# Patient Record
Sex: Female | Born: 1940 | Race: Black or African American | Hispanic: No | Smoking: Never smoker
Health system: Southern US, Community
[De-identification: ages and names within clinical notes are randomized; demographics above are authoritative.]

## PROBLEM LIST (undated history)

## (undated) DIAGNOSIS — I10 Essential (primary) hypertension: Secondary | ICD-10-CM

---

## 1995-05-28 ENCOUNTER — Encounter (INDEPENDENT_AMBULATORY_CARE_PROVIDER_SITE_OTHER): Payer: Self-pay | Admitting: *Deleted

## 1996-11-08 ENCOUNTER — Encounter: Payer: Self-pay | Admitting: Internal Medicine

## 1996-11-12 LAB — TB SKIN TEST: TB Skin Test: NEGATIVE mm

## 1996-12-22 ENCOUNTER — Encounter: Payer: Self-pay | Admitting: Internal Medicine

## 1997-02-20 ENCOUNTER — Encounter: Payer: Self-pay | Admitting: Internal Medicine

## 1997-10-09 ENCOUNTER — Encounter: Payer: Self-pay | Admitting: Internal Medicine

## 1998-01-22 ENCOUNTER — Encounter: Payer: Self-pay | Admitting: Internal Medicine

## 1998-06-11 ENCOUNTER — Encounter: Admission: RE | Admit: 1998-06-11 | Discharge: 1998-06-11 | Payer: Self-pay | Admitting: Internal Medicine

## 1998-06-11 ENCOUNTER — Encounter: Payer: Self-pay | Admitting: Internal Medicine

## 1998-07-09 ENCOUNTER — Encounter: Admission: RE | Admit: 1998-07-09 | Discharge: 1998-07-09 | Payer: Self-pay | Admitting: Internal Medicine

## 1998-07-25 ENCOUNTER — Ambulatory Visit (HOSPITAL_COMMUNITY): Admission: RE | Admit: 1998-07-25 | Discharge: 1998-07-25 | Payer: Self-pay | Admitting: Gastroenterology

## 1998-09-11 ENCOUNTER — Encounter: Payer: Self-pay | Admitting: Internal Medicine

## 1998-09-11 ENCOUNTER — Encounter: Admission: RE | Admit: 1998-09-11 | Discharge: 1998-09-11 | Payer: Self-pay | Admitting: Internal Medicine

## 1998-10-09 ENCOUNTER — Encounter: Admission: RE | Admit: 1998-10-09 | Discharge: 1998-10-09 | Payer: Self-pay | Admitting: Internal Medicine

## 1998-11-06 ENCOUNTER — Encounter: Admission: RE | Admit: 1998-11-06 | Discharge: 1998-11-06 | Payer: Self-pay | Admitting: Infectious Diseases

## 1999-01-08 ENCOUNTER — Ambulatory Visit (HOSPITAL_COMMUNITY): Admission: RE | Admit: 1999-01-08 | Discharge: 1999-01-08 | Payer: Self-pay | Admitting: Infectious Diseases

## 1999-01-22 ENCOUNTER — Encounter: Admission: RE | Admit: 1999-01-22 | Discharge: 1999-01-22 | Payer: Self-pay | Admitting: Internal Medicine

## 1999-04-08 ENCOUNTER — Ambulatory Visit (HOSPITAL_COMMUNITY): Admission: RE | Admit: 1999-04-08 | Discharge: 1999-04-08 | Payer: Self-pay | Admitting: Internal Medicine

## 1999-04-23 ENCOUNTER — Encounter: Admission: RE | Admit: 1999-04-23 | Discharge: 1999-04-23 | Payer: Self-pay | Admitting: Internal Medicine

## 1999-07-05 ENCOUNTER — Ambulatory Visit (HOSPITAL_COMMUNITY): Admission: RE | Admit: 1999-07-05 | Discharge: 1999-07-05 | Payer: Self-pay | Admitting: Internal Medicine

## 1999-07-05 ENCOUNTER — Encounter: Admission: RE | Admit: 1999-07-05 | Discharge: 1999-07-05 | Payer: Self-pay | Admitting: Internal Medicine

## 1999-07-22 ENCOUNTER — Encounter: Admission: RE | Admit: 1999-07-22 | Discharge: 1999-07-22 | Payer: Self-pay | Admitting: Internal Medicine

## 1999-07-22 ENCOUNTER — Other Ambulatory Visit: Admission: RE | Admit: 1999-07-22 | Discharge: 1999-07-22 | Payer: Self-pay | Admitting: Radiology

## 1999-07-23 ENCOUNTER — Other Ambulatory Visit: Admission: RE | Admit: 1999-07-23 | Discharge: 1999-07-23 | Payer: Self-pay | Admitting: Radiology

## 1999-08-09 ENCOUNTER — Encounter (HOSPITAL_BASED_OUTPATIENT_CLINIC_OR_DEPARTMENT_OTHER): Payer: Self-pay | Admitting: General Surgery

## 1999-08-15 ENCOUNTER — Encounter (HOSPITAL_BASED_OUTPATIENT_CLINIC_OR_DEPARTMENT_OTHER): Payer: Self-pay | Admitting: General Surgery

## 1999-08-15 ENCOUNTER — Ambulatory Visit (HOSPITAL_COMMUNITY): Admission: RE | Admit: 1999-08-15 | Discharge: 1999-08-15 | Payer: Self-pay | Admitting: General Surgery

## 1999-08-22 ENCOUNTER — Encounter: Admission: RE | Admit: 1999-08-22 | Discharge: 1999-08-22 | Payer: Self-pay | Admitting: Internal Medicine

## 1999-09-12 ENCOUNTER — Encounter: Admission: RE | Admit: 1999-09-12 | Discharge: 1999-12-11 | Payer: Self-pay | Admitting: Radiation Oncology

## 1999-09-16 ENCOUNTER — Encounter: Admission: RE | Admit: 1999-09-16 | Discharge: 1999-09-16 | Payer: Self-pay | Admitting: Internal Medicine

## 1999-09-16 ENCOUNTER — Ambulatory Visit (HOSPITAL_COMMUNITY): Admission: RE | Admit: 1999-09-16 | Discharge: 1999-09-16 | Payer: Self-pay | Admitting: Internal Medicine

## 1999-09-30 ENCOUNTER — Encounter: Admission: RE | Admit: 1999-09-30 | Discharge: 1999-09-30 | Payer: Self-pay | Admitting: Internal Medicine

## 2000-05-25 ENCOUNTER — Ambulatory Visit (HOSPITAL_COMMUNITY): Admission: RE | Admit: 2000-05-25 | Discharge: 2000-05-25 | Payer: Self-pay | Admitting: Internal Medicine

## 2000-05-25 ENCOUNTER — Encounter: Admission: RE | Admit: 2000-05-25 | Discharge: 2000-05-25 | Payer: Self-pay | Admitting: Internal Medicine

## 2000-06-08 ENCOUNTER — Encounter: Admission: RE | Admit: 2000-06-08 | Discharge: 2000-06-08 | Payer: Self-pay | Admitting: Internal Medicine

## 2000-10-12 ENCOUNTER — Ambulatory Visit (HOSPITAL_COMMUNITY): Admission: RE | Admit: 2000-10-12 | Discharge: 2000-10-12 | Payer: Self-pay | Admitting: Internal Medicine

## 2000-10-12 ENCOUNTER — Encounter: Admission: RE | Admit: 2000-10-12 | Discharge: 2000-10-12 | Payer: Self-pay | Admitting: Internal Medicine

## 2000-10-15 ENCOUNTER — Encounter: Admission: RE | Admit: 2000-10-15 | Discharge: 2000-10-15 | Payer: Self-pay | Admitting: Family Medicine

## 2000-10-15 ENCOUNTER — Encounter: Payer: Self-pay | Admitting: Family Medicine

## 2001-05-19 ENCOUNTER — Ambulatory Visit (HOSPITAL_COMMUNITY): Admission: RE | Admit: 2001-05-19 | Discharge: 2001-05-19 | Payer: Self-pay | Admitting: Internal Medicine

## 2001-05-19 ENCOUNTER — Encounter: Admission: RE | Admit: 2001-05-19 | Discharge: 2001-05-19 | Payer: Self-pay | Admitting: Internal Medicine

## 2001-06-01 ENCOUNTER — Encounter: Admission: RE | Admit: 2001-06-01 | Discharge: 2001-06-01 | Payer: Self-pay | Admitting: Internal Medicine

## 2002-05-30 ENCOUNTER — Ambulatory Visit (HOSPITAL_COMMUNITY): Admission: RE | Admit: 2002-05-30 | Discharge: 2002-05-30 | Payer: Self-pay | Admitting: Internal Medicine

## 2002-05-30 ENCOUNTER — Encounter: Admission: RE | Admit: 2002-05-30 | Discharge: 2002-05-30 | Payer: Self-pay | Admitting: Internal Medicine

## 2003-05-31 ENCOUNTER — Encounter: Payer: Self-pay | Admitting: Internal Medicine

## 2003-05-31 ENCOUNTER — Encounter: Admission: RE | Admit: 2003-05-31 | Discharge: 2003-05-31 | Payer: Self-pay | Admitting: Internal Medicine

## 2003-05-31 ENCOUNTER — Ambulatory Visit (HOSPITAL_COMMUNITY): Admission: RE | Admit: 2003-05-31 | Discharge: 2003-05-31 | Payer: Self-pay | Admitting: Internal Medicine

## 2003-06-13 ENCOUNTER — Encounter: Admission: RE | Admit: 2003-06-13 | Discharge: 2003-06-13 | Payer: Self-pay | Admitting: Internal Medicine

## 2003-10-19 ENCOUNTER — Encounter: Payer: Self-pay | Admitting: Internal Medicine

## 2003-10-19 ENCOUNTER — Encounter: Admission: RE | Admit: 2003-10-19 | Discharge: 2003-10-19 | Payer: Self-pay | Admitting: Internal Medicine

## 2003-10-19 ENCOUNTER — Ambulatory Visit (HOSPITAL_COMMUNITY): Admission: RE | Admit: 2003-10-19 | Discharge: 2003-10-19 | Payer: Self-pay | Admitting: Internal Medicine

## 2004-05-27 ENCOUNTER — Encounter: Admission: RE | Admit: 2004-05-27 | Discharge: 2004-05-27 | Payer: Self-pay | Admitting: Internal Medicine

## 2004-05-27 ENCOUNTER — Ambulatory Visit (HOSPITAL_COMMUNITY): Admission: RE | Admit: 2004-05-27 | Discharge: 2004-05-27 | Payer: Self-pay | Admitting: Internal Medicine

## 2004-06-10 ENCOUNTER — Encounter: Admission: RE | Admit: 2004-06-10 | Discharge: 2004-06-10 | Payer: Self-pay | Admitting: Internal Medicine

## 2004-07-11 ENCOUNTER — Ambulatory Visit: Payer: Self-pay | Admitting: Internal Medicine

## 2004-10-15 ENCOUNTER — Ambulatory Visit (HOSPITAL_COMMUNITY): Admission: RE | Admit: 2004-10-15 | Discharge: 2004-10-15 | Payer: Self-pay | Admitting: Internal Medicine

## 2004-10-15 ENCOUNTER — Ambulatory Visit: Payer: Self-pay | Admitting: Internal Medicine

## 2004-12-03 ENCOUNTER — Ambulatory Visit: Payer: Self-pay | Admitting: Internal Medicine

## 2005-05-12 ENCOUNTER — Ambulatory Visit: Payer: Self-pay | Admitting: Internal Medicine

## 2005-05-12 ENCOUNTER — Ambulatory Visit (HOSPITAL_COMMUNITY): Admission: RE | Admit: 2005-05-12 | Discharge: 2005-05-12 | Payer: Self-pay | Admitting: Internal Medicine

## 2005-05-27 ENCOUNTER — Ambulatory Visit: Payer: Self-pay | Admitting: Internal Medicine

## 2005-11-11 ENCOUNTER — Ambulatory Visit (HOSPITAL_COMMUNITY): Admission: RE | Admit: 2005-11-11 | Discharge: 2005-11-11 | Payer: Self-pay | Admitting: Internal Medicine

## 2005-11-11 ENCOUNTER — Ambulatory Visit: Payer: Self-pay | Admitting: Internal Medicine

## 2005-11-11 ENCOUNTER — Encounter (INDEPENDENT_AMBULATORY_CARE_PROVIDER_SITE_OTHER): Payer: Self-pay | Admitting: *Deleted

## 2005-11-11 LAB — CONVERTED CEMR LAB: CD4 Count: 360 microliters

## 2005-11-25 ENCOUNTER — Ambulatory Visit: Payer: Self-pay | Admitting: Internal Medicine

## 2006-05-19 ENCOUNTER — Ambulatory Visit: Payer: Self-pay | Admitting: Internal Medicine

## 2006-05-19 ENCOUNTER — Encounter: Admission: RE | Admit: 2006-05-19 | Discharge: 2006-05-19 | Payer: Self-pay | Admitting: Internal Medicine

## 2006-05-19 ENCOUNTER — Encounter (INDEPENDENT_AMBULATORY_CARE_PROVIDER_SITE_OTHER): Payer: Self-pay | Admitting: *Deleted

## 2006-05-19 LAB — CONVERTED CEMR LAB
CD4 Count: 290 microliters
HIV 1 RNA Quant: 2170 copies/mL

## 2006-06-02 ENCOUNTER — Ambulatory Visit: Payer: Self-pay | Admitting: Internal Medicine

## 2006-06-02 ENCOUNTER — Encounter: Admission: RE | Admit: 2006-06-02 | Discharge: 2006-06-02 | Payer: Self-pay | Admitting: Internal Medicine

## 2006-06-09 ENCOUNTER — Ambulatory Visit: Payer: Self-pay | Admitting: Internal Medicine

## 2006-08-25 ENCOUNTER — Ambulatory Visit: Payer: Self-pay | Admitting: Internal Medicine

## 2006-08-25 ENCOUNTER — Encounter (INDEPENDENT_AMBULATORY_CARE_PROVIDER_SITE_OTHER): Payer: Self-pay | Admitting: *Deleted

## 2006-08-25 ENCOUNTER — Encounter: Admission: RE | Admit: 2006-08-25 | Discharge: 2006-08-25 | Payer: Self-pay | Admitting: Internal Medicine

## 2006-08-25 LAB — CONVERTED CEMR LAB
ALT: 23 units/L (ref 0–40)
AST: 27 units/L (ref 0–37)
Albumin: 3.6 g/dL (ref 3.5–5.2)
Alkaline Phosphatase: 82 units/L (ref 39–117)
BUN: 16 mg/dL (ref 6–23)
Basophils Absolute: 0 10*3/uL (ref 0.0–0.1)
Basophils percent auto: 0 % (ref 0–1)
CD4 Count: 310 uL
CO2: 27 meq/L (ref 19–32)
Calcium: 9.6 mg/dL (ref 8.4–10.5)
Chloride: 99 meq/L (ref 96–112)
Creatinine, Ser: 0.8 mg/dL (ref 0.40–1.20)
Eosinophils Absolute: 0 cells/mcL (ref 0.0–0.7)
Eosinophils Relative: 1 % (ref 0–5)
Glucose, Bld: 74 mg/dL (ref 70–99)
HCT: 31.7 % — ABNORMAL LOW (ref 36.0–46.0)
HIV 1 RNA Quant: 1900 copies/mL — ABNORMAL HIGH (ref <50)
HIV 1 RNA Quant: 1900 {copies}/mL
HIV-1 RNA Quant, Log: 3.28 — ABNORMAL HIGH (ref <1.70)
Hemoglobin: 10 g/dL — ABNORMAL LOW (ref 12.0–15.0)
Leukocyte count, blood: 4.7 10*9/L (ref 4.0–10.5)
Lymphocytes Relative: 43 % (ref 12–46)
Lymphs Abs: 2 10*3/uL (ref 0.7–3.3)
MCHC: 31.5 g/dL (ref 30.0–36.0)
MCV: 86.4 fL (ref 78.0–100.0)
Monocytes Absolute: 0.6 10*3/uL (ref 0.2–0.7)
Monocytes Relative: 13 % — ABNORMAL HIGH (ref 3–11)
Neutro Abs: 2.1 10*3/uL (ref 1.7–7.7)
Neutrophils Relative %: 44 % (ref 43–77)
Platelets: 271 10*3/uL (ref 150–400)
Potassium: 4 meq/L (ref 3.5–5.3)
RBC: 3.67 M/uL — ABNORMAL LOW (ref 3.87–5.11)
RDW: 14 % (ref 11.5–14.0)
Sodium: 134 meq/L — ABNORMAL LOW (ref 135–145)
Total Bilirubin: 0.4 mg/dL (ref 0.3–1.2)
Total Protein: 9.7 g/dL — ABNORMAL HIGH (ref 6.0–8.3)

## 2006-09-08 ENCOUNTER — Ambulatory Visit: Payer: Self-pay | Admitting: Internal Medicine

## 2006-12-07 ENCOUNTER — Encounter: Admission: RE | Admit: 2006-12-07 | Discharge: 2006-12-07 | Payer: Self-pay | Admitting: Internal Medicine

## 2006-12-07 ENCOUNTER — Encounter (INDEPENDENT_AMBULATORY_CARE_PROVIDER_SITE_OTHER): Payer: Self-pay | Admitting: *Deleted

## 2006-12-07 ENCOUNTER — Ambulatory Visit: Payer: Self-pay | Admitting: Internal Medicine

## 2006-12-07 LAB — CONVERTED CEMR LAB
ALT: 18 units/L (ref 0–35)
AST: 25 units/L (ref 0–37)
Albumin: 3.7 g/dL (ref 3.5–5.2)
Alkaline Phosphatase: 88 units/L (ref 39–117)
BUN: 14 mg/dL (ref 6–23)
Basophils Absolute: 0 10*3/uL (ref 0.0–0.1)
Basophils Relative: 1 % (ref 0–1)
CO2: 25 meq/L (ref 19–32)
Calcium: 9.6 mg/dL (ref 8.4–10.5)
Chloride: 100 meq/L (ref 96–112)
Cholesterol: 178 mg/dL (ref 0–200)
Creatinine, Ser: 0.82 mg/dL (ref 0.40–1.20)
Eosinophils Absolute: 0 10*3/uL (ref 0.0–0.7)
Eosinophils Relative: 1 % (ref 0–5)
Glucose, Bld: 95 mg/dL (ref 70–99)
HCT: 33.7 % — ABNORMAL LOW (ref 36.0–46.0)
HDL: 44 mg/dL (ref >39)
HIV 1 RNA Quant: 1510 copies/mL
HIV 1 RNA Quant: 1510 copies/mL — ABNORMAL HIGH (ref <50)
HIV-1 RNA Quant, Log: 3.18 — ABNORMAL HIGH (ref <1.70)
Hemoglobin: 10.8 g/dL — ABNORMAL LOW (ref 12.0–15.0)
LDL Cholesterol: 110 mg/dL — ABNORMAL HIGH (ref 0–99)
Lymphocytes Relative: 45 % (ref 12–46)
Lymphs Abs: 1.6 10*3/uL (ref 0.7–3.3)
MCHC: 32 g/dL (ref 30.0–36.0)
MCV: 86 fL (ref 78.0–100.0)
Monocytes Absolute: 0.4 10*3/uL (ref 0.2–0.7)
Monocytes Relative: 12 % — ABNORMAL HIGH (ref 3–11)
Neutro Abs: 1.5 10*3/uL — ABNORMAL LOW (ref 1.7–7.7)
Neutrophils Relative %: 42 % — ABNORMAL LOW (ref 43–77)
Platelets: 286 10*3/uL (ref 150–400)
Potassium: 4 meq/L (ref 3.5–5.3)
RBC: 3.92 M/uL (ref 3.87–5.11)
RDW: 13.9 % (ref 11.5–14.0)
Sodium: 131 meq/L — ABNORMAL LOW (ref 135–145)
Total Bilirubin: 0.3 mg/dL (ref 0.3–1.2)
Total CHOL/HDL Ratio: 4
Total Protein: 10.2 g/dL — ABNORMAL HIGH (ref 6.0–8.3)
Triglycerides: 118 mg/dL (ref <150)
VLDL: 24 mg/dL (ref 0–40)
WBC: 3.6 10*3/uL — ABNORMAL LOW (ref 4.0–10.5)

## 2006-12-14 DIAGNOSIS — K210 Gastro-esophageal reflux disease with esophagitis: Secondary | ICD-10-CM

## 2006-12-14 DIAGNOSIS — B20 Human immunodeficiency virus [HIV] disease: Secondary | ICD-10-CM | POA: Insufficient documentation

## 2006-12-14 DIAGNOSIS — D6489 Other specified anemias: Secondary | ICD-10-CM | POA: Insufficient documentation

## 2006-12-14 DIAGNOSIS — I10 Essential (primary) hypertension: Secondary | ICD-10-CM | POA: Insufficient documentation

## 2006-12-14 DIAGNOSIS — L708 Other acne: Secondary | ICD-10-CM

## 2006-12-14 DIAGNOSIS — H209 Unspecified iridocyclitis: Secondary | ICD-10-CM | POA: Insufficient documentation

## 2006-12-14 DIAGNOSIS — E785 Hyperlipidemia, unspecified: Secondary | ICD-10-CM

## 2006-12-14 DIAGNOSIS — Z853 Personal history of malignant neoplasm of breast: Secondary | ICD-10-CM

## 2006-12-16 DIAGNOSIS — Z9079 Acquired absence of other genital organ(s): Secondary | ICD-10-CM | POA: Insufficient documentation

## 2006-12-16 DIAGNOSIS — Z9889 Other specified postprocedural states: Secondary | ICD-10-CM | POA: Insufficient documentation

## 2006-12-21 ENCOUNTER — Encounter (INDEPENDENT_AMBULATORY_CARE_PROVIDER_SITE_OTHER): Payer: Self-pay | Admitting: *Deleted

## 2006-12-21 LAB — CONVERTED CEMR LAB

## 2006-12-22 ENCOUNTER — Ambulatory Visit: Payer: Self-pay | Admitting: Internal Medicine

## 2006-12-22 ENCOUNTER — Encounter: Payer: Self-pay | Admitting: Internal Medicine

## 2006-12-23 ENCOUNTER — Encounter: Payer: Self-pay | Admitting: Internal Medicine

## 2006-12-24 ENCOUNTER — Encounter: Admission: RE | Admit: 2006-12-24 | Discharge: 2006-12-24 | Payer: Self-pay | Admitting: Family Medicine

## 2007-01-03 ENCOUNTER — Encounter (INDEPENDENT_AMBULATORY_CARE_PROVIDER_SITE_OTHER): Payer: Self-pay | Admitting: *Deleted

## 2007-03-09 ENCOUNTER — Ambulatory Visit: Payer: Self-pay | Admitting: Internal Medicine

## 2007-03-09 ENCOUNTER — Encounter: Admission: RE | Admit: 2007-03-09 | Discharge: 2007-03-09 | Payer: Self-pay | Admitting: Internal Medicine

## 2007-03-09 LAB — CONVERTED CEMR LAB
HIV 1 RNA Quant: 1330 copies/mL — ABNORMAL HIGH (ref <50)
HIV-1 RNA Quant, Log: 3.12 — ABNORMAL HIGH (ref <1.70)

## 2007-03-23 ENCOUNTER — Ambulatory Visit: Payer: Self-pay | Admitting: Internal Medicine

## 2007-04-07 ENCOUNTER — Encounter: Payer: Self-pay | Admitting: Internal Medicine

## 2007-06-08 ENCOUNTER — Ambulatory Visit: Payer: Self-pay | Admitting: Internal Medicine

## 2007-06-08 ENCOUNTER — Encounter: Admission: RE | Admit: 2007-06-08 | Discharge: 2007-06-08 | Payer: Self-pay | Admitting: Internal Medicine

## 2007-06-08 LAB — CONVERTED CEMR LAB
AST: 28 units/L (ref 0–37)
Albumin: 3.6 g/dL (ref 3.5–5.2)
BUN: 13 mg/dL (ref 6–23)
Basophils Relative: 0 % (ref 0–1)
Calcium: 9.7 mg/dL (ref 8.4–10.5)
Chloride: 102 meq/L (ref 96–112)
HDL: 47 mg/dL (ref >39)
HIV 1 RNA Quant: 4770 copies/mL — ABNORMAL HIGH (ref <50)
HIV-1 RNA Quant, Log: 3.68 — ABNORMAL HIGH (ref <1.70)
Lymphs Abs: 1.5 10*3/uL (ref 0.7–3.3)
MCHC: 31.3 g/dL (ref 30.0–36.0)
Monocytes Relative: 15 % — ABNORMAL HIGH (ref 3–11)
Neutro Abs: 1.4 10*3/uL — ABNORMAL LOW (ref 1.7–7.7)
Neutrophils Relative %: 40 % — ABNORMAL LOW (ref 43–77)
Potassium: 3.8 meq/L (ref 3.5–5.3)
RBC: 4.07 M/uL (ref 3.87–5.11)
Total Protein: 9.3 g/dL — ABNORMAL HIGH (ref 6.0–8.3)
WBC: 3.5 10*3/uL — ABNORMAL LOW (ref 4.0–10.5)

## 2007-06-16 ENCOUNTER — Encounter (INDEPENDENT_AMBULATORY_CARE_PROVIDER_SITE_OTHER): Payer: Self-pay | Admitting: *Deleted

## 2007-06-22 ENCOUNTER — Ambulatory Visit: Payer: Self-pay | Admitting: Internal Medicine

## 2007-08-24 ENCOUNTER — Ambulatory Visit: Payer: Self-pay | Admitting: Internal Medicine

## 2007-08-24 ENCOUNTER — Encounter: Admission: RE | Admit: 2007-08-24 | Discharge: 2007-08-24 | Payer: Self-pay | Admitting: Internal Medicine

## 2007-08-24 LAB — CONVERTED CEMR LAB
HIV 1 RNA Quant: 6410 copies/mL — ABNORMAL HIGH (ref <50)
HIV-1 RNA Quant, Log: 3.81 — ABNORMAL HIGH (ref <1.70)

## 2007-09-07 ENCOUNTER — Ambulatory Visit: Payer: Self-pay | Admitting: Internal Medicine

## 2007-10-26 ENCOUNTER — Encounter: Payer: Self-pay | Admitting: Internal Medicine

## 2007-11-25 ENCOUNTER — Ambulatory Visit: Payer: Self-pay | Admitting: Internal Medicine

## 2007-11-25 ENCOUNTER — Encounter: Admission: RE | Admit: 2007-11-25 | Discharge: 2007-11-25 | Payer: Self-pay | Admitting: Internal Medicine

## 2007-11-25 LAB — CONVERTED CEMR LAB
ALT: 29 units/L (ref 0–35)
AST: 32 units/L (ref 0–37)
CO2: 25 meq/L (ref 19–32)
Calcium: 10.1 mg/dL (ref 8.4–10.5)
Chloride: 99 meq/L (ref 96–112)
Cholesterol: 182 mg/dL (ref 0–200)
HCT: 36.3 % (ref 36.0–46.0)
HIV 1 RNA Quant: 3860 copies/mL — ABNORMAL HIGH (ref <50)
HIV-1 RNA Quant, Log: 3.59 — ABNORMAL HIGH (ref <1.70)
Platelets: 297 10*3/uL (ref 150–400)
Potassium: 4.3 meq/L (ref 3.5–5.3)
Sodium: 132 meq/L — ABNORMAL LOW (ref 135–145)
Total Protein: 10.3 g/dL — ABNORMAL HIGH (ref 6.0–8.3)
WBC: 4.6 10*3/uL (ref 4.0–10.5)

## 2007-12-01 ENCOUNTER — Encounter (INDEPENDENT_AMBULATORY_CARE_PROVIDER_SITE_OTHER): Payer: Self-pay | Admitting: *Deleted

## 2007-12-07 ENCOUNTER — Ambulatory Visit: Payer: Self-pay | Admitting: Internal Medicine

## 2007-12-07 DIAGNOSIS — F411 Generalized anxiety disorder: Secondary | ICD-10-CM | POA: Insufficient documentation

## 2007-12-10 ENCOUNTER — Encounter: Payer: Self-pay | Admitting: Internal Medicine

## 2008-02-15 ENCOUNTER — Encounter: Admission: RE | Admit: 2008-02-15 | Discharge: 2008-02-15 | Payer: Self-pay | Admitting: Internal Medicine

## 2008-02-15 ENCOUNTER — Ambulatory Visit: Payer: Self-pay | Admitting: Internal Medicine

## 2008-02-15 LAB — CONVERTED CEMR LAB: HIV-1 RNA Quant, Log: 3.35 — ABNORMAL HIGH (ref <1.70)

## 2008-03-07 ENCOUNTER — Ambulatory Visit: Payer: Self-pay | Admitting: Internal Medicine

## 2008-04-18 ENCOUNTER — Ambulatory Visit: Payer: Self-pay | Admitting: Internal Medicine

## 2008-04-18 ENCOUNTER — Encounter: Admission: RE | Admit: 2008-04-18 | Discharge: 2008-04-18 | Payer: Self-pay | Admitting: Internal Medicine

## 2008-04-18 LAB — CONVERTED CEMR LAB
ALT: 17 units/L (ref 0–35)
AST: 25 units/L (ref 0–37)
CO2: 25 meq/L (ref 19–32)
Chloride: 103 meq/L (ref 96–112)
Cholesterol: 187 mg/dL (ref 0–200)
Creatinine, Ser: 0.76 mg/dL (ref 0.40–1.20)
HIV 1 RNA Quant: 84 copies/mL — ABNORMAL HIGH (ref <50)
HIV-1 RNA Quant, Log: 1.92 — ABNORMAL HIGH (ref <1.70)
MCV: 89.6 fL (ref 78.0–100.0)
Platelets: 267 10*3/uL (ref 150–400)
RDW: 14.5 % (ref 11.5–15.5)
Sodium: 135 meq/L (ref 135–145)
Total Bilirubin: 0.3 mg/dL (ref 0.3–1.2)
Total CHOL/HDL Ratio: 3.7
Total Protein: 8.8 g/dL — ABNORMAL HIGH (ref 6.0–8.3)
VLDL: 21 mg/dL (ref 0–40)
WBC: 3.3 10*3/uL — ABNORMAL LOW (ref 4.0–10.5)

## 2008-05-09 ENCOUNTER — Ambulatory Visit: Payer: Self-pay | Admitting: Internal Medicine

## 2008-08-01 ENCOUNTER — Ambulatory Visit: Payer: Self-pay | Admitting: Internal Medicine

## 2008-08-01 LAB — CONVERTED CEMR LAB: HIV 1 RNA Quant: <50 copies/mL (ref <50)

## 2008-08-15 ENCOUNTER — Ambulatory Visit: Payer: Self-pay | Admitting: Internal Medicine

## 2008-10-31 ENCOUNTER — Ambulatory Visit: Payer: Self-pay | Admitting: Internal Medicine

## 2008-10-31 LAB — CONVERTED CEMR LAB
ALT: 13 units/L (ref 0–35)
AST: 19 units/L (ref 0–37)
Albumin: 4.1 g/dL (ref 3.5–5.2)
Alkaline Phosphatase: 113 units/L (ref 39–117)
BUN: 17 mg/dL (ref 6–23)
Basophils Absolute: 0 10*3/uL (ref 0.0–0.1)
Basophils Relative: 1 % (ref 0–1)
Calcium: 9.9 mg/dL (ref 8.4–10.5)
Chloride: 100 meq/L (ref 96–112)
Eosinophils Absolute: 0 10*3/uL (ref 0.0–0.7)
HDL: 64 mg/dL (ref >39)
HIV 1 RNA Quant: <48 copies/mL (ref <48)
LDL Cholesterol: 138 mg/dL — ABNORMAL HIGH (ref 0–99)
MCHC: 31.1 g/dL (ref 30.0–36.0)
MCV: 90.8 fL (ref 78.0–100.0)
Neutro Abs: 2.5 10*3/uL (ref 1.7–7.7)
Neutrophils Relative %: 60 % (ref 43–77)
Platelets: 324 10*3/uL (ref 150–400)
Potassium: 3.9 meq/L (ref 3.5–5.3)
RBC: 4 M/uL (ref 3.87–5.11)
RDW: 13.7 % (ref 11.5–15.5)
Sodium: 137 meq/L (ref 135–145)

## 2008-11-14 ENCOUNTER — Ambulatory Visit: Payer: Self-pay | Admitting: Internal Medicine

## 2008-11-14 DIAGNOSIS — H9319 Tinnitus, unspecified ear: Secondary | ICD-10-CM | POA: Insufficient documentation

## 2008-12-14 ENCOUNTER — Telehealth: Payer: Self-pay | Admitting: Internal Medicine

## 2009-02-06 ENCOUNTER — Ambulatory Visit: Payer: Self-pay | Admitting: Internal Medicine

## 2009-02-06 LAB — CONVERTED CEMR LAB
HIV 1 RNA Quant: 4880 copies/mL — ABNORMAL HIGH (ref <48)
HIV-1 RNA Quant, Log: 3.69 — ABNORMAL HIGH (ref <1.68)

## 2009-02-20 ENCOUNTER — Ambulatory Visit: Payer: Self-pay | Admitting: Internal Medicine

## 2009-04-17 ENCOUNTER — Ambulatory Visit: Payer: Self-pay | Admitting: Internal Medicine

## 2009-05-01 ENCOUNTER — Ambulatory Visit: Payer: Self-pay | Admitting: Internal Medicine

## 2009-07-31 ENCOUNTER — Ambulatory Visit: Payer: Self-pay | Admitting: Internal Medicine

## 2009-07-31 LAB — CONVERTED CEMR LAB
HIV 1 RNA Quant: 3380 copies/mL — ABNORMAL HIGH (ref <48)
HIV-1 RNA Quant, Log: 3.53 — ABNORMAL HIGH (ref <1.68)

## 2009-08-14 ENCOUNTER — Ambulatory Visit: Payer: Self-pay | Admitting: Internal Medicine

## 2009-11-27 ENCOUNTER — Ambulatory Visit: Payer: Self-pay | Admitting: Internal Medicine

## 2009-11-27 LAB — CONVERTED CEMR LAB
Albumin: 3.6 g/dL (ref 3.5–5.2)
BUN: 13 mg/dL (ref 6–23)
CO2: 24 meq/L (ref 19–32)
Calcium: 9.3 mg/dL (ref 8.4–10.5)
Cholesterol: 169 mg/dL (ref 0–200)
Eosinophils Relative: 2 % (ref 0–5)
Glucose, Bld: 66 mg/dL — ABNORMAL LOW (ref 70–99)
HCT: 34.7 % — ABNORMAL LOW (ref 36.0–46.0)
HIV-1 RNA Quant, Log: 3.81 — ABNORMAL HIGH (ref <1.68)
Hemoglobin: 10.6 g/dL — ABNORMAL LOW (ref 12.0–15.0)
Lymphocytes Relative: 44 % (ref 12–46)
MCHC: 30.5 g/dL (ref 30.0–36.0)
Monocytes Absolute: 0.3 10*3/uL (ref 0.1–1.0)
Monocytes Relative: 7 % (ref 3–12)
Neutro Abs: 1.8 10*3/uL (ref 1.7–7.7)
Potassium: 3.9 meq/L (ref 3.5–5.3)
RBC: 3.98 M/uL (ref 3.87–5.11)
Sodium: 133 meq/L — ABNORMAL LOW (ref 135–145)
Total Protein: 8.9 g/dL — ABNORMAL HIGH (ref 6.0–8.3)
Triglycerides: 88 mg/dL (ref <150)

## 2009-12-11 ENCOUNTER — Ambulatory Visit: Payer: Self-pay | Admitting: Internal Medicine

## 2010-04-23 ENCOUNTER — Ambulatory Visit: Payer: Self-pay | Admitting: Internal Medicine

## 2010-04-23 LAB — CONVERTED CEMR LAB
HIV 1 RNA Quant: 8210 copies/mL — ABNORMAL HIGH (ref <48)
HIV-1 RNA Quant, Log: 3.91 — ABNORMAL HIGH (ref <1.68)

## 2010-06-04 ENCOUNTER — Ambulatory Visit: Payer: Self-pay | Admitting: Internal Medicine

## 2010-09-05 ENCOUNTER — Ambulatory Visit: Payer: Self-pay | Admitting: Internal Medicine

## 2010-09-05 LAB — CONVERTED CEMR LAB
HIV 1 RNA Quant: 20300 copies/mL — ABNORMAL HIGH (ref <20)
HIV-1 RNA Quant, Log: 4.31 — ABNORMAL HIGH (ref <1.30)

## 2010-10-03 ENCOUNTER — Ambulatory Visit: Payer: Self-pay | Admitting: Internal Medicine

## 2010-11-20 ENCOUNTER — Encounter (INDEPENDENT_AMBULATORY_CARE_PROVIDER_SITE_OTHER): Payer: Self-pay | Admitting: *Deleted

## 2010-11-26 NOTE — Assessment & Plan Note (Signed)
Summary: Madison Newman   Primary Provider:  Dr Arvella Nigh  CC:  f/u and Depression.  History of Present Illness: Madison Newman is in for her routine visit.  She says that she's feeling very good and has lots of energy.  She remains on diastolic but recently had the dose doubled because her blood pressure had remained elevated.  Depression History:      The patient denies a depressed mood most of the day and a diminished interest in her usual daily activities.        Preventive Screening-Counseling & Management  Alcohol-Tobacco     Alcohol drinks/day: 0     Alcohol type: wine, 3 glasses a year     Smoking Status: never     Passive Smoke Exposure: no  Caffeine-Diet-Exercise     Caffeine use/day: no     Does Patient Exercise: yes     Type of exercise: sit-ups, jumping jacks     Exercise (avg: min/session): <30     Times/week: 7  Hep-HIV-STD-Contraception     HIV Risk: no risk noted  Safety-Violence-Falls     Seat Belt Use: yes      Sexual History:  n/a.        Drug Use:  never.     Updated Prior Medication List: BYSTOLIC 20 MG TABS (NEBIVOLOL HCL) Take 1 tablet by mouth once a day  Current Allergies: No known allergies  Vital Signs:  Patient profile:   70 year old female Menstrual status:  hysterectomy, 1986 Height:      65 inches (165.10 cm) Weight:      153 pounds (69.55 kg) BMI:     25.55 Temp:     97.3 degrees F (36.28 degrees C) oral Pulse rate:   69 / minute BP sitting:   193 / 78  (left arm)  Vitals Entered By: Jennet Maduro RN (December 11, 2009 8:47 AM) CC: f/u, Depression Is Patient Diabetic? No Pain Assessment Patient in pain? no      Nutritional Status BMI of 25 - 29 = overweight Nutritional Status Detail appetite "good"  Have you ever been in a relationship where you felt threatened, hurt or afraid?No   Does patient need assistance? Functional Status Self care Ambulation Normal Comments no missed doses of rxes   Physical Exam  General:  alert  and well-nourished.   Mouth:  good dentition and pharynx pink and moist.   Lungs:  normal breath sounds.  no crackles and no wheezes.   Heart:  normal rate, regular rhythm, and no murmur.      Impression & Recommendations:  Problem # 1:  HIV DISEASE (ICD-042) Madison Newman continues to desire to remain off of antiretroviral therapy.  However she says that she has been a little nervous about what her lab work might show.  She says that she feels if her CD4 were dropped to 300 or less she would be inclined to restart treatment.  She says that she worries about feeling worse while on medication.  I have reminded her that since she is asymptomatic with her infection, no treatment regimen can make her feel better now but that I would certainly work with her to try to find a regimen that will have as few side effects as possible.  We agreed her repeat lab work and have a visit in 3 months. Diagnostics Reviewed:  HIV: HIV positive - not AIDS (11/14/2008)   CD4: 340 (11/28/2009)   WBC: 3.8 (11/27/2009)   Hgb: 10.6 (11/27/2009)  HCT: 34.7 (11/27/2009)   Platelets: 263 (11/27/2009) HIV-1 RNA: 6520 (11/27/2009)   HBSAg: No (12/21/2006)  Medications Added to Medication List This Visit: 1)  Bystolic 20 Mg Tabs (Nebivolol hcl) .... Take 1 tablet by mouth once a day  Other Orders: Est. Patient Level III (46962) Future Orders: T-CD4SP ([facility]) (CD4SP) ... 03/11/2010 T-HIV Viral Load [phone]) ... 03/11/2010  Patient Instructions: 1)  Please schedule a follow-up appointment in 3 months.

## 2010-11-26 NOTE — Assessment & Plan Note (Signed)
Summary: fu refrom 7/21/kam   Primary Provider:  Dr Arvella Nigh  CC:  follow-up visit, lab results, and B/P elevated.  History of Present Illness: Madison Newman is in for her routine visit.  She denies having any problems or concerns since her last visit.  She continues to take her Bystolic but is on no other medications.  She has not been sexually active.  Preventive Screening-Counseling & Management  Alcohol-Tobacco     Alcohol drinks/day: 0     Alcohol type: wine, 3 glasses a year     Smoking Status: never     Passive Smoke Exposure: no  Caffeine-Diet-Exercise     Caffeine use/day: no     Does Patient Exercise: yes     Type of exercise: sit-ups, jumping jacks     Exercise (avg: min/session): <30     Times/week: 7  Hep-HIV-STD-Contraception     HIV Risk: no risk noted  Safety-Violence-Falls     Seat Belt Use: yes      Sexual History:  n/a.        Drug Use:  never.     Prior Medication List:  BYSTOLIC 20 MG TABS (NEBIVOLOL HCL) Take 1 tablet by mouth once a day   Current Allergies (reviewed today): No known allergies  Vital Signs:  Patient profile:   70 year old female Menstrual status:  hysterectomy, 1986 Height:      65 inches (165.10 cm) Weight:      151.8 pounds (69 kg) BMI:     25.35 Temp:     98.6 degrees F (37.00 degrees C) Pulse rate:   69 / minute BP sitting:   183 / 73  (left arm)  Vitals Entered By: Wendall Mola CMA Duncan Dull) (June 04, 2010 11:33 AM) CC: follow-up visit, lab results,B/P elevated Is Patient Diabetic? No Pain Assessment Patient in pain? no      Nutritional Status BMI of 25 - 29 = overweight Nutritional Status Detail appetite "good"  Have you ever been in a relationship where you felt threatened, hurt or afraid?No   Does patient need assistance? Functional Status Self care Ambulation Normal Comments no missed doses of B/P med. per patient   Physical Exam  General:  alert and well-nourished.   Mouth:  good dentition  and pharynx pink and moist.   Lungs:  normal breath sounds.  no crackles and no wheezes.   Heart:  normal rate, regular rhythm, and no murmur.             Prevention For Positives: 06/04/2010   Safe sex practices discussed with patient. Condoms offered.                             Impression & Recommendations:  Problem # 1:  HIV DISEASE (ICD-042) Her CD4 count is down slightly and her viral load is up higher than it has been in the last 5 to 6 years.  I had another discussion with her about the current guidelines and the recent shift in consensus opinion toward treating at higher CD4 counts.  Given her current guidelines she is certainly a candidate to restart anti-retroviral therapy but she is quite reluctant to do this.  Her past experience has been that she has trouble tolerating any regimen she has been on it she does not want to restart medications at this time.  She does agree to follow-up after blood work every 3 months. Diagnostics Reviewed:  HIV: HIV positive - not AIDS (11/14/2008)   CD4: 320 (04/24/2010)   WBC: 3.8 (11/27/2009)   Hgb: 10.6 (11/27/2009)   HCT: 34.7 (11/27/2009)   Platelets: 263 (11/27/2009) HIV-1 RNA: 8210 (04/23/2010)   HBSAg: No (12/21/2006)  Other Orders: Est. Patient Level III (16109) Future Orders: T-CD4SP ([facility]) (CD4SP) ... 09/02/2010 T-HIV Viral Load [id number]) ... 09/02/2010  Patient Instructions: 1)  Please schedule a follow-up appointment in 3 months.

## 2010-11-26 NOTE — Assessment & Plan Note (Signed)
Summary: F/U OV/VS   Primary Provider:  Dr Arvella Nigh  CC:  follow-up visit.  History of Present Illness: Madison Newman is in for her routine visit.  She says that she is anxious in today because her blood pressure was elevated when she checked the hand and she is always somewhat anxious when she comes in to see what her blood work reveals.  She continues to take her Bystolic and says she monitors her blood pressure several times a day at home.  She says that it's always normal at home usually running around 116/60.  However she says that her blood pressure is always elevated when she goes to the doctor.  She says that she's had 24-hour blood pressure monitoring that shows that her blood pressure is normal throughout most of the day.  Otherwise she is feeling well and continues to stay active.  Preventive Screening-Counseling & Management  Alcohol-Tobacco     Alcohol drinks/day: 0     Alcohol type: wine, 3 glasses a year     Smoking Status: never     Passive Smoke Exposure: no  Caffeine-Diet-Exercise     Caffeine use/day: no     Does Patient Exercise: yes     Type of exercise: sit-ups, jumping jacks     Exercise (avg: min/session): <30     Times/week: 7  Hep-HIV-STD-Contraception     HIV Risk: no risk noted     HIV Risk Counseling: not indicated-no HIV risk noted  Safety-Violence-Falls     Seat Belt Use: yes  Comments: declined condoms      Sexual History:  n/a.        Drug Use:  never.     Prior Medication List:  BYSTOLIC 20 MG TABS (NEBIVOLOL HCL) Take 1 tablet by mouth once a day   Current Allergies (reviewed today): No known allergies  Vital Signs:  Patient profile:   70 year old female Menstrual status:  hysterectomy, 1986 Height:      65 inches (165.10 cm) Weight:      153.5 pounds (69.77 kg) BMI:     25.64 Temp:     98.1 degrees F (36.72 degrees C) oral Pulse rate:   68 / minute BP sitting:   206 / 98  (left arm) Cuff size:   regular  Vitals Entered By:  Jennet Maduro RN (October 03, 2010 10:10 AM) CC: follow-up visit Is Patient Diabetic? No Pain Assessment Patient in pain? no      Nutritional Status BMI of 25 - 29 = overweight Nutritional Status Detail appetite "good"  Have you ever been in a relationship where you felt threatened, hurt or afraid?No   Does patient need assistance? Functional Status Self care Ambulation Normal Comments no missed doses of rxes   Physical Exam  General:  alert and well-nourished.   Mouth:  good dentition and pharynx pink and moist.   Lungs:  normal breath sounds.  no crackles and no wheezes.   Heart:  normal rate, regular rhythm, and no murmur.   Skin:  no rashes.   Axillary Nodes:  no R axillary adenopathy and no L axillary adenopathy.   Psych:  normally interactive, good eye contact, not anxious appearing, and not depressed appearing.      Impression & Recommendations:  Problem # 1:  HIV DISEASE (ICD-042) Madison Newman his CD4 count remains stable in the mid 300 range but her viral load is up today.  I have had another long conversation with her about the  potential benefits of restarting anti-retroviral therapy.  She appears to be waiting for me to tell her when to restart medications.  She continues to have a great deal of concern about potential side effects.  She does however say that when she was on Atripla  it was not any more difficult to tolerate and the other regimen she had taken in the past.  She did have vivid dreams while taking it and was concerned that it might be increasing her blood pressure.  She does have insurance and does not seem to think that the co-pay he would be a financial strain.  She is willing to come back after blood work in 3 months and consider restarting therapy. Diagnostics Reviewed:  HIV: HIV positive - not AIDS (11/14/2008)   CD4: 360 (09/06/2010)   WBC: 3.8 (11/27/2009)   Hgb: 10.6 (11/27/2009)   HCT: 34.7 (11/27/2009)   Platelets: 263 (11/27/2009) HIV-1 RNA:  20300 (09/05/2010)   HBSAg: No (12/21/2006)  Problem # 2:  HYPERTENSION (ICD-401.9) Her blood pressure is quite elevated today.  She appears to have significant white coat hypertension.  She will be meeting with Dr. Parke Simmers soon and intends to discuss this with her. Her updated medication list for this problem includes:    Bystolic 20 Mg Tabs (Nebivolol hcl) .Marland Kitchen... Take 1 tablet by mouth once a day  Orders: Est. Patient Level III (23536)  BP today: 206/98 Prior BP: 183/73 (06/04/2010)  Labs Reviewed: K+: 3.9 (11/27/2009) Creat: : 0.81 (11/27/2009)   Chol: 169 (11/27/2009)   HDL: 44 (11/27/2009)   LDL: 107 (11/27/2009)   TG: 88 (11/27/2009)  Other Orders: Influenza Vaccine MCR (14431) Future Orders: T-CD4SP ([facility]) (CD4SP) ... 01/01/2011 T-HIV Viral Load [id number]) ... 01/01/2011 T-Comprehensive Metabolic Panel [id number]) ... 01/01/2011 T-CBC w/Diff ([id number]) ... 01/01/2011 T-RPR (Syphilis) [id number]) ... 01/01/2011 T-Lipid Profile [id number]) ... 01/01/2011  Patient Instructions: 1)  Please schedule a follow-up appointment in 3 months.    Influenza Vaccine    Vaccine Type: Fluvax MCR    Site: left deltoid    Mfr: novartis    Dose: 0.5 ml    Route: IM    Given by: Jennet Maduro RN    Exp. Date: 01/26/2011    Lot #: [id number]  Flu Vaccine Consent Questions    Do you have a history of severe allergic reactions to this vaccine? no    Any prior history of allergic reactions to egg and/or gelatin? no    Do you have a sensitivity to the preservative Thimersol? no    Do you have a past history of Guillan-Barre Syndrome? no    Do you currently have an acute febrile illness? no    Have you ever had a severe reaction to latex? no    Vaccine information given and explained to patient? yes    Are you currently pregnant? no

## 2010-11-28 NOTE — Miscellaneous (Signed)
  Clinical Lists Changes  Observations: Added new observation of INCOMESOURCE: UNKNOWN (11/20/2010 11:55) Added new observation of HOUSEINCOME: 0  (11/20/2010 11:55)

## 2011-01-07 ENCOUNTER — Encounter: Payer: Self-pay | Admitting: Internal Medicine

## 2011-01-07 ENCOUNTER — Other Ambulatory Visit: Payer: Self-pay | Admitting: Internal Medicine

## 2011-01-07 ENCOUNTER — Other Ambulatory Visit (INDEPENDENT_AMBULATORY_CARE_PROVIDER_SITE_OTHER): Payer: Medicare PPO

## 2011-01-07 DIAGNOSIS — B20 Human immunodeficiency virus [HIV] disease: Secondary | ICD-10-CM

## 2011-01-07 LAB — CONVERTED CEMR LAB
AST: 21 units/L (ref 0–37)
Albumin: 3.8 g/dL (ref 3.5–5.2)
Alkaline Phosphatase: 75 units/L (ref 39–117)
BUN: 13 mg/dL (ref 6–23)
Basophils Absolute: 0 10*3/uL (ref 0.0–0.1)
Calcium: 10.1 mg/dL (ref 8.4–10.5)
Creatinine, Ser: 0.83 mg/dL (ref 0.40–1.20)
Eosinophils Relative: 2 % (ref 0–5)
Glucose, Bld: 73 mg/dL (ref 70–99)
HCT: 33.1 % — ABNORMAL LOW (ref 36.0–46.0)
HDL: 48 mg/dL (ref >39)
HIV 1 RNA Quant: 19600 copies/mL — ABNORMAL HIGH (ref <20)
Hemoglobin: 10.7 g/dL — ABNORMAL LOW (ref 12.0–15.0)
LDL Cholesterol: 114 mg/dL — ABNORMAL HIGH (ref 0–99)
Lymphocytes Relative: 29 % (ref 12–46)
MCHC: 32.3 g/dL (ref 30.0–36.0)
Monocytes Absolute: 0.6 10*3/uL (ref 0.1–1.0)
Monocytes Relative: 13 % — ABNORMAL HIGH (ref 3–12)
Neutro Abs: 2.6 10*3/uL (ref 1.7–7.7)
RBC: 3.83 M/uL — ABNORMAL LOW (ref 3.87–5.11)
RDW: 13.5 % (ref 11.5–15.5)
Total CHOL/HDL Ratio: 3.6

## 2011-01-08 LAB — T-HELPER CELL (CD4) - (RCID CLINIC ONLY): CD4 % Helper T Cell: 19 % — ABNORMAL LOW (ref 33–55)

## 2011-01-12 LAB — T-HELPER CELL (CD4) - (RCID CLINIC ONLY)
CD4 % Helper T Cell: 24 % — ABNORMAL LOW (ref 33–55)
CD4 T Cell Abs: 320 uL — ABNORMAL LOW (ref 400–2700)

## 2011-01-15 LAB — T-HELPER CELL (CD4) - (RCID CLINIC ONLY)
CD4 % Helper T Cell: 27 % — ABNORMAL LOW (ref 33–55)
CD4 T Cell Abs: 340 uL — ABNORMAL LOW (ref 400–2700)

## 2011-01-21 ENCOUNTER — Ambulatory Visit (INDEPENDENT_AMBULATORY_CARE_PROVIDER_SITE_OTHER): Payer: Medicare PPO | Admitting: Internal Medicine

## 2011-01-21 ENCOUNTER — Encounter: Payer: Self-pay | Admitting: Internal Medicine

## 2011-01-21 VITALS — BP 199/76 | HR 65 | Temp 98.3°F | Ht 65.5 in | Wt 152.4 lb

## 2011-01-21 DIAGNOSIS — B20 Human immunodeficiency virus [HIV] disease: Secondary | ICD-10-CM

## 2011-01-21 NOTE — Assessment & Plan Note (Signed)
I recommended restarting ART but she continues to be concerned about possible side effects.I negotiated a return visit after repeat labs in 6 weeks to revisit this.

## 2011-01-21 NOTE — Progress Notes (Signed)
[name] is in for her routine visit.She say that scripture has told her she will only live 3 more years so she doesn't want to take HIV meds and risk feeling worse for her remaining years.  Physical Examination: General appearance - alert, well appearing, and in no distress Mouth - mucous membranes moist, pharynx normal without lesions Chest - clear to auscultation, no wheezes, rales or rhonchi, symmetric air entry Heart - normal rate, regular rhythm, normal S1, S2, no murmurs

## 2011-01-21 NOTE — Patient Instructions (Signed)
Return after repeat blood work in 6 weeks to reconsider restarting therapy.

## 2011-03-04 ENCOUNTER — Other Ambulatory Visit (INDEPENDENT_AMBULATORY_CARE_PROVIDER_SITE_OTHER): Payer: Medicare PPO

## 2011-03-04 DIAGNOSIS — B20 Human immunodeficiency virus [HIV] disease: Secondary | ICD-10-CM

## 2011-03-05 LAB — T-HELPER CELL (CD4) - (RCID CLINIC ONLY)
CD4 % Helper T Cell: 23 % — ABNORMAL LOW (ref 33–55)
CD4 T Cell Abs: 310 uL — ABNORMAL LOW (ref 400–2700)

## 2011-03-18 ENCOUNTER — Ambulatory Visit (INDEPENDENT_AMBULATORY_CARE_PROVIDER_SITE_OTHER): Payer: Medicare PPO | Admitting: Internal Medicine

## 2011-03-18 ENCOUNTER — Encounter: Payer: Self-pay | Admitting: Internal Medicine

## 2011-03-18 VITALS — BP 189/77 | HR 67 | Temp 98.2°F | Ht 65.0 in | Wt 152.0 lb

## 2011-03-18 DIAGNOSIS — B20 Human immunodeficiency virus [HIV] disease: Secondary | ICD-10-CM

## 2011-03-18 NOTE — Assessment & Plan Note (Signed)
I reviewed again the potential benefits of restarting antiretroviral therapy including decrease risk of opportunistic infection, reduced inflammation that might impact her risk for cancers and metabolic syndrome, and decreased risk of transmission which in her case is not currently applying she is not sexually active. I told her that there are newer regimens that she might tolerate better than past regimens including a once daily regimen of Complera. She still does not want to restart therapy but will come back after repeat lab work in 6 weeks.

## 2011-03-18 NOTE — Progress Notes (Signed)
  Subjective:    Patient ID: Madison Newman, female    DOB: [birth year], 70 y.o.   MRN: [id number]  HPI Madison is in for a routine visit. She has not had any new problems since her last visit. She continues to care for her 21 year old mother who has disabling rheumatoid arthritis and Alzheimer's disease. She says that she believes that her medical problems were probably caused by taking too many medications when she was younger. She reiterates that she never feels well when she is on medications for her HIV. At her last visit she told me that scripture told her that she would die within the next 3 years so th proceed the need or benefit from restarting therapy. She states that she is spiritual and this factors and to not wanting to restart treatment.   Review of Systems     Objective:   Physical Exam  Constitutional: She appears well-developed and well-nourished. No distress.  HENT:  Mouth/Throat: Oropharynx is clear and moist. No oropharyngeal exudate.  Cardiovascular: Normal rate, regular rhythm and normal heart sounds.   No murmur heard. Pulmonary/Chest: Breath sounds normal. She has no wheezes.  Psychiatric: She has a normal mood and affect.          Assessment & Plan:

## 2011-04-29 ENCOUNTER — Other Ambulatory Visit: Payer: Medicare PPO

## 2011-04-29 ENCOUNTER — Ambulatory Visit: Payer: Medicare PPO | Admitting: Internal Medicine

## 2011-04-29 ENCOUNTER — Other Ambulatory Visit (INDEPENDENT_AMBULATORY_CARE_PROVIDER_SITE_OTHER): Payer: Medicare PPO

## 2011-04-29 DIAGNOSIS — B20 Human immunodeficiency virus [HIV] disease: Secondary | ICD-10-CM

## 2011-05-01 LAB — HIV-1 RNA QUANT-NO REFLEX-BLD
HIV 1 RNA Quant: 12300 copies/mL — ABNORMAL HIGH (ref <20)
HIV-1 RNA Quant, Log: 4.09 {Log} — ABNORMAL HIGH (ref <1.30)

## 2011-05-13 ENCOUNTER — Encounter: Payer: Self-pay | Admitting: Internal Medicine

## 2011-05-13 ENCOUNTER — Ambulatory Visit (INDEPENDENT_AMBULATORY_CARE_PROVIDER_SITE_OTHER): Payer: Medicare PPO | Admitting: Internal Medicine

## 2011-05-13 VITALS — BP 193/74 | HR 71 | Temp 98.1°F | Ht 65.5 in | Wt 159.0 lb

## 2011-05-13 DIAGNOSIS — B20 Human immunodeficiency virus [HIV] disease: Secondary | ICD-10-CM

## 2011-05-13 NOTE — Progress Notes (Signed)
  Subjective:    Patient ID: Madison Newman, female    DOB: [birth year], 70 y.o.   MRN: [id number]  HPI Madison is in for her routine visit. She states that she always gets very nervous before her visits here are and that causes her blood pressure to be increased. She monitors her blood pressure at home on a daily basis she does very consistently around 120/70. She checked it this morning and it was down before coming to the visit. She has not changed any of her medications. She is looking forward to her annual family reunion in [place] next month.    Review of Systems     Objective:   Physical Exam  Constitutional: She appears well-developed and well-nourished. No distress.  HENT:  Mouth/Throat: Oropharynx is clear and moist. No oropharyngeal exudate.  Cardiovascular: Normal rate, regular rhythm and normal heart sounds.   No murmur heard. Pulmonary/Chest: Breath sounds normal. She has no wheezes.  Psychiatric: She has a normal mood and affect.          Assessment & Plan:

## 2011-05-13 NOTE — Assessment & Plan Note (Signed)
Her CD4 count and viral load remains relatively stable. I reviewed the potential benefits of restarting antiretroviral therapy with her again but she remains reluctant, perceiving all medications as toxic. I reviewed with her several potential regimens for her to research between now and her next visit.

## 2011-07-17 LAB — T-HELPER CELL (CD4) - (RCID CLINIC ONLY): CD4 T Cell Abs: 300 — ABNORMAL LOW

## 2011-07-22 LAB — T-HELPER CELL (CD4) - (RCID CLINIC ONLY): CD4 % Helper T Cell: 16 — ABNORMAL LOW

## 2011-07-28 LAB — T-HELPER CELL (CD4) - (RCID CLINIC ONLY)
CD4 % Helper T Cell: 32 — ABNORMAL LOW
CD4 T Cell Abs: 400

## 2011-08-06 LAB — T-HELPER CELL (CD4) - (RCID CLINIC ONLY): CD4 % Helper T Cell: 22 — ABNORMAL LOW

## 2011-08-11 LAB — T-HELPER CELL (CD4) - (RCID CLINIC ONLY)
CD4 % Helper T Cell: 21 — ABNORMAL LOW
CD4 T Cell Abs: 280 — ABNORMAL LOW

## 2011-08-12 ENCOUNTER — Other Ambulatory Visit: Payer: Self-pay | Admitting: Internal Medicine

## 2011-08-12 ENCOUNTER — Other Ambulatory Visit: Payer: Medicare PPO

## 2011-08-12 DIAGNOSIS — B20 Human immunodeficiency virus [HIV] disease: Secondary | ICD-10-CM

## 2011-08-13 LAB — T-HELPER CELL (CD4) - (RCID CLINIC ONLY): CD4 % Helper T Cell: 20 % — ABNORMAL LOW (ref 33–55)

## 2011-08-14 LAB — HIV-1 RNA QUANT-NO REFLEX-BLD: HIV 1 RNA Quant: 24700 copies/mL — ABNORMAL HIGH (ref <20)

## 2011-08-26 ENCOUNTER — Ambulatory Visit (INDEPENDENT_AMBULATORY_CARE_PROVIDER_SITE_OTHER): Payer: Medicare Other | Admitting: Internal Medicine

## 2011-08-26 ENCOUNTER — Encounter: Payer: Self-pay | Admitting: Internal Medicine

## 2011-08-26 VITALS — BP 199/78 | HR 64 | Temp 97.7°F | Ht 65.5 in | Wt 150.8 lb

## 2011-08-26 DIAGNOSIS — B20 Human immunodeficiency virus [HIV] disease: Secondary | ICD-10-CM

## 2011-08-26 DIAGNOSIS — Z23 Encounter for immunization: Secondary | ICD-10-CM

## 2011-08-26 NOTE — Assessment & Plan Note (Signed)
Madison Newman continues to struggle with her decision about restarting antiretroviral medications. Her CD4 count is down further to 260 and her viral load is higher than average 24,700. For financial reasons it would be advantageous for her to have a one pill a day regimen. She does not want to retry Atripla because of the effects it had on her dreams and sleep. I have given her information about Complera and she will research that 20 now and her next visit in 3 months.

## 2011-08-26 NOTE — Progress Notes (Signed)
  Subjective:    Patient ID: Madison Newman, female    DOB: [birth year], 70 y.o.   MRN: [id number]  HPI Madison is in for her routine visit. She states that she is especially nervous today about what her blood work will show. She states that she has not morbid or depressed but that she has no desire to live a long life. She is currently caring for her mother who is in the end stages of dementia and she states that she would never want to linger. She says this because this is a motivating factor in her not wanting to restart antiretroviral medications. She is also concerned about potential side effects of the medications because she had trouble tolerating previous regimens many years ago. She says that if she does decide to restart medications that she will make every effort to commit to taking them consistently.    Review of Systems     Objective:   Physical Exam  Constitutional: She appears well-developed and well-nourished. No distress.  HENT:  Mouth/Throat: Oropharynx is clear and moist. No oropharyngeal exudate.  Cardiovascular: Normal rate, regular rhythm and normal heart sounds.   No murmur heard. Pulmonary/Chest: Breath sounds normal. She has no wheezes. She has no rales.  Skin: No rash noted.  Psychiatric: She has a normal mood and affect.          Assessment & Plan:

## 2011-11-01 DIAGNOSIS — E119 Type 2 diabetes mellitus without complications: Secondary | ICD-10-CM | POA: Diagnosis not present

## 2011-11-01 DIAGNOSIS — Z0000 Encounter for general adult medical examination without abnormal findings: Secondary | ICD-10-CM | POA: Diagnosis not present

## 2011-11-01 DIAGNOSIS — I10 Essential (primary) hypertension: Secondary | ICD-10-CM | POA: Diagnosis not present

## 2011-11-11 ENCOUNTER — Other Ambulatory Visit (INDEPENDENT_AMBULATORY_CARE_PROVIDER_SITE_OTHER): Payer: Medicare PPO

## 2011-11-11 DIAGNOSIS — B20 Human immunodeficiency virus [HIV] disease: Secondary | ICD-10-CM | POA: Diagnosis not present

## 2011-11-11 DIAGNOSIS — Z113 Encounter for screening for infections with a predominantly sexual mode of transmission: Secondary | ICD-10-CM

## 2011-11-11 DIAGNOSIS — Z79899 Other long term (current) drug therapy: Secondary | ICD-10-CM

## 2011-11-11 LAB — COMPLETE METABOLIC PANEL WITH GFR
ALT: 13 U/L (ref 0–35)
CO2: 27 mEq/L (ref 19–32)
Calcium: 10.3 mg/dL (ref 8.4–10.5)
Chloride: 98 mEq/L (ref 96–112)
Creat: 0.93 mg/dL (ref 0.50–1.10)
GFR, Est African American: 73 mL/min
GFR, Est Non African American: 63 mL/min
Glucose, Bld: 80 mg/dL (ref 70–99)
Total Protein: 9.4 g/dL — ABNORMAL HIGH (ref 6.0–8.3)

## 2011-11-11 LAB — CBC
HCT: 32.8 % — ABNORMAL LOW (ref 36.0–46.0)
Hemoglobin: 10.7 g/dL — ABNORMAL LOW (ref 12.0–15.0)
MCV: 86.8 fL (ref 78.0–100.0)
Platelets: 260 10*3/uL (ref 150–400)
RBC: 3.78 MIL/uL — ABNORMAL LOW (ref 3.87–5.11)
WBC: 3.6 10*3/uL — ABNORMAL LOW (ref 4.0–10.5)

## 2011-11-11 LAB — LIPID PANEL
LDL Cholesterol: 106 mg/dL — ABNORMAL HIGH (ref 0–99)
Triglycerides: 56 mg/dL (ref <150)

## 2011-11-11 LAB — RPR

## 2011-11-12 LAB — T-HELPER CELL (CD4) - (RCID CLINIC ONLY): CD4 % Helper T Cell: 21 % — ABNORMAL LOW (ref 33–55)

## 2011-11-25 ENCOUNTER — Encounter: Payer: Self-pay | Admitting: Internal Medicine

## 2011-11-25 ENCOUNTER — Ambulatory Visit (INDEPENDENT_AMBULATORY_CARE_PROVIDER_SITE_OTHER): Payer: Medicare PPO | Admitting: Internal Medicine

## 2011-11-25 VITALS — BP 207/87 | HR 68 | Temp 98.0°F | Wt 149.8 lb

## 2011-11-25 DIAGNOSIS — B20 Human immunodeficiency virus [HIV] disease: Secondary | ICD-10-CM

## 2011-11-25 NOTE — Progress Notes (Signed)
Patient ID: Madison Newman, female   DOB: [birth year], 71 y.o.   MRN: [id number]  INFECTIOUS DISEASE PROGRESS NOTE    Subjective: Madison is in for her routine visit. She continues to take her to blood pressure medications and a variety of supplements and vitamins. She still is reluctant to restart antiretroviral therapy. She has been working in [employer] and says that she was not following a very healthy diet but recently started to cut down on her carbohydrates. She has been concerned about her elevated blood pressure  Objective:   General: Her blood pressure today is 208/98 Skin: No rash or adenopathy Lungs: Clear Cor: Regular S1 and S2 no murmurs Abdomen: Nontender   Lab Results Lab Results  Component Value Date   WBC 3.6* 11/11/2011   HGB 10.7* 11/11/2011   HCT 32.8* 11/11/2011   MCV 86.8 11/11/2011   PLT 260 11/11/2011    Lab Results  Component Value Date   CREATININE 0.93 11/11/2011   BUN 15 11/11/2011   NA 133* 11/11/2011   K 4.0 11/11/2011   CL 98 11/11/2011   CO2 27 11/11/2011    Lab Results  Component Value Date   ALT 13 11/11/2011   AST 22 11/11/2011   ALKPHOS 66 11/11/2011   BILITOT 0.4 11/11/2011      HIV 1 RNA Quant (copies/mL)  Date Value  11/11/2011 12769*  08/12/2011 24700*  04/29/2011 12300*     CD4 T Cell Abs (cmm)  Date Value  11/11/2011 320*  08/12/2011 260*  04/29/2011 360*     Microbiology: No results found for this or any previous visit (from the past 240 hour(s)).  Studies/Results: No results found.   Assessment: Her HIV viral load and CD4 count remained relatively stable over the last few years. According to our guidelines she does have an indication to start therapy but she is reluctant to do this. I will have her followup after repeat lab work in 3 months. I've told her that I am more concerned about her elevated blood pressure than her HIV infection. She is due to followup with Dr. Parke Simmers within the next  month.  Plan: 1. Return after lab work in 3 months   Cliffton Asters, MD [facility] [phone] pager   [phone] cell 11/25/2011, 9:14 AM

## 2012-01-12 ENCOUNTER — Other Ambulatory Visit: Payer: Self-pay | Admitting: Licensed Clinical Social Worker

## 2012-01-12 DIAGNOSIS — B20 Human immunodeficiency virus [HIV] disease: Secondary | ICD-10-CM

## 2012-02-10 ENCOUNTER — Other Ambulatory Visit: Payer: Medicare PPO

## 2012-02-10 DIAGNOSIS — B20 Human immunodeficiency virus [HIV] disease: Secondary | ICD-10-CM

## 2012-02-10 LAB — CBC WITH DIFFERENTIAL/PLATELET
Basophils Absolute: 0 10*3/uL (ref 0.0–0.1)
Lymphocytes Relative: 39 % (ref 12–46)
Neutro Abs: 1.6 10*3/uL — ABNORMAL LOW (ref 1.7–7.7)
Platelets: 248 10*3/uL (ref 150–400)
RDW: 13.4 % (ref 11.5–15.5)
WBC: 3.4 10*3/uL — ABNORMAL LOW (ref 4.0–10.5)

## 2012-02-10 LAB — COMPREHENSIVE METABOLIC PANEL
ALT: 14 U/L (ref 0–35)
AST: 23 U/L (ref 0–37)
Albumin: 3.7 g/dL (ref 3.5–5.2)
Calcium: 9.7 mg/dL (ref 8.4–10.5)
Chloride: 101 mEq/L (ref 96–112)
Potassium: 4.1 mEq/L (ref 3.5–5.3)
Sodium: 134 mEq/L — ABNORMAL LOW (ref 135–145)
Total Protein: 8.9 g/dL — ABNORMAL HIGH (ref 6.0–8.3)

## 2012-02-11 LAB — T-HELPER CELL (CD4) - (RCID CLINIC ONLY): CD4 % Helper T Cell: 19 % — ABNORMAL LOW (ref 33–55)

## 2012-02-24 ENCOUNTER — Encounter: Payer: Self-pay | Admitting: Internal Medicine

## 2012-02-24 ENCOUNTER — Ambulatory Visit (INDEPENDENT_AMBULATORY_CARE_PROVIDER_SITE_OTHER): Payer: Medicare PPO | Admitting: Internal Medicine

## 2012-02-24 VITALS — BP 217/78 | HR 64 | Temp 98.3°F | Ht 65.5 in | Wt 152.5 lb

## 2012-02-24 DIAGNOSIS — B20 Human immunodeficiency virus [HIV] disease: Secondary | ICD-10-CM

## 2012-02-24 NOTE — Progress Notes (Signed)
Patient ID: Madison Newman, female   DOB: 04-05-1941, 71 y.o.   MRN: [id number]  INFECTIOUS DISEASE PROGRESS NOTE    Subjective: Madison is in for her routine appointment. She states that she remains under a great deal of stress caring for her mother who is in the late stages of dementia. Her mother tends to be violent toward others which makes it very difficult for Madison. She is also very concerned about her blood pressure and the inability of her current regimen to keep it under control.  Objective: Temp: 98.3 F (36.8 C) (04/30 0903) Temp src: Oral (04/30 0903) BP: 217/78 mmHg (04/30 0903) Pulse Rate: 64  (04/30 0903)  General: She seems more stressed today Skin: No rash Lungs: Clear Cor: Regular S1-S2 no murmurs  Lab Results HIV 1 RNA Quant (copies/mL)  Date Value  02/10/2012 15881*  11/11/2011 12769*  08/12/2011 24700*     CD4 T Cell Abs (cmm)  Date Value  02/10/2012 250*  11/11/2011 320*  08/12/2011 260*     Assessment: Her CD4 count has slowly been trending down. She is a candidate for antiretroviral therapy but still is quite reluctant to start any new medications. I will continue to see her at 3 month intervals. I suggested that she followup with Dr. Parke Simmers to discuss her antihypertensive regimen.  Plan: 1. Continue observation off of antiretroviral therapy 2. Followup after lab work in 3 months   Cliffton Asters, MD [facility] [phone] pager   [phone] cell 02/24/2012, 9:21 AM

## 2012-02-27 DIAGNOSIS — B20 Human immunodeficiency virus [HIV] disease: Secondary | ICD-10-CM | POA: Diagnosis not present

## 2012-02-27 DIAGNOSIS — E119 Type 2 diabetes mellitus without complications: Secondary | ICD-10-CM | POA: Diagnosis not present

## 2012-02-27 DIAGNOSIS — I10 Essential (primary) hypertension: Secondary | ICD-10-CM | POA: Diagnosis not present

## 2012-02-27 DIAGNOSIS — E7800 Pure hypercholesterolemia, unspecified: Secondary | ICD-10-CM | POA: Diagnosis not present

## 2012-02-27 DIAGNOSIS — R0602 Shortness of breath: Secondary | ICD-10-CM | POA: Diagnosis not present

## 2012-03-31 DIAGNOSIS — I10 Essential (primary) hypertension: Secondary | ICD-10-CM | POA: Diagnosis not present

## 2012-05-03 DIAGNOSIS — I10 Essential (primary) hypertension: Secondary | ICD-10-CM | POA: Diagnosis not present

## 2012-05-17 ENCOUNTER — Other Ambulatory Visit: Payer: Medicare PPO

## 2012-05-17 DIAGNOSIS — B20 Human immunodeficiency virus [HIV] disease: Secondary | ICD-10-CM | POA: Diagnosis not present

## 2012-05-18 LAB — T-HELPER CELL (CD4) - (RCID CLINIC ONLY)
CD4 % Helper T Cell: 18 % — ABNORMAL LOW (ref 33–55)
CD4 T Cell Abs: 200 uL — ABNORMAL LOW (ref 400–2700)

## 2012-05-31 ENCOUNTER — Ambulatory Visit (INDEPENDENT_AMBULATORY_CARE_PROVIDER_SITE_OTHER): Payer: Medicare PPO | Admitting: Internal Medicine

## 2012-05-31 ENCOUNTER — Encounter: Payer: Self-pay | Admitting: Internal Medicine

## 2012-05-31 VITALS — BP 175/78 | HR 76 | Temp 98.5°F | Ht 66.0 in | Wt 147.0 lb

## 2012-05-31 DIAGNOSIS — B20 Human immunodeficiency virus [HIV] disease: Secondary | ICD-10-CM | POA: Diagnosis not present

## 2012-05-31 NOTE — Progress Notes (Signed)
Patient ID: [name] W Jenne, female   DOB: December 20, 1940, 71 y.o.   MRN: [id number]     [facility] for Infectious Disease  Patient Active Problem List  Diagnosis  . HIV DISEASE  . HYPERLIPIDEMIA  . ANEMIA NEC  . ANXIETY  . IRITIS  . TINNITUS  . HYPERTENSION  . REFLUX ESOPHAGITIS  . ACNE NEC  . BREAST CANCER, HX OF  . LUMPECTOMY, BREAST, HX OF  . TAH/BSO, HX OF    Patient's Medications  New Prescriptions   No medications on file  Previous Medications   ASCORBIC ACID (VITAMIN C) 100 MG TABLET    Take 100 mg by mouth daily.   CALCIUM CARBONATE 200 MG CAPSULE    Take 250 mg by mouth 2 (two) times daily with a meal.   NEBIVOLOL HCL (BYSTOLIC) 20 MG TABS    Take 1 tablet by mouth daily.     SPIRONOLACTONE (ALDACTONE) 12.5 MG TABS    Take 12.5 mg by mouth daily.     VITAMIN D, ERGOCALCIFEROL, (DRISDOL) 50000 UNITS CAPS    Take 50,000 Units by mouth.  Modified Medications   No medications on file  Discontinued Medications   No medications on file    Subjective: [name] is in for her routine visit. She denies any new problems. Her mother is in the hospital at [facility].she is preparing for her annual family reunion.  Objective: Temp: 98.5 F (36.9 C) (08/05 1429) Temp src: Oral (08/05 1429) BP: 175/78 mmHg (08/05 1429) Pulse Rate: 76  (08/05 1429)  General: she is in good spirits Skin: no rash Lungs: clear Cor: regular S1 and S2 no murmurs  Lab Results HIV 1 RNA Quant (copies/mL)  Date Value  05/17/2012 24556*  02/10/2012 15881*  11/11/2011 46962*     CD4 T Cell Abs (cmm)  Date Value  05/17/2012 200*  02/10/2012 250*  11/11/2011 320*     Assessment: Her CD4 count continues to decline. She states that she is willing to start antiretroviral therapy but would like to repeat her blood work one more time in one month. She also wants to wait until after her family reunion is over before starting any new medications.  Plan: 1. Repeat CD4 and  viral load in one month 2. Followup in 6 weeks to reconsider starting antiretroviral therapy   Cliffton Asters, MD [facility] for Infectious Disease [facility] [phone] pager   [phone] cell 05/31/2012, 2:41 PM

## 2012-06-29 ENCOUNTER — Other Ambulatory Visit: Payer: Medicare PPO

## 2012-07-13 ENCOUNTER — Ambulatory Visit: Payer: Medicare PPO | Admitting: Internal Medicine

## 2012-07-26 ENCOUNTER — Other Ambulatory Visit (INDEPENDENT_AMBULATORY_CARE_PROVIDER_SITE_OTHER): Payer: Medicare PPO

## 2012-07-26 DIAGNOSIS — B20 Human immunodeficiency virus [HIV] disease: Secondary | ICD-10-CM | POA: Diagnosis not present

## 2012-07-27 LAB — T-HELPER CELL (CD4) - (RCID CLINIC ONLY): CD4 T Cell Abs: 210 uL — ABNORMAL LOW (ref 400–2700)

## 2012-07-27 LAB — HIV-1 RNA QUANT-NO REFLEX-BLD: HIV-1 RNA Quant, Log: 4.34 {Log} — ABNORMAL HIGH (ref <1.30)

## 2012-07-29 ENCOUNTER — Ambulatory Visit: Payer: Medicare PPO | Admitting: Internal Medicine

## 2012-08-12 ENCOUNTER — Ambulatory Visit (INDEPENDENT_AMBULATORY_CARE_PROVIDER_SITE_OTHER): Payer: Medicare PPO | Admitting: Internal Medicine

## 2012-08-12 ENCOUNTER — Encounter: Payer: Self-pay | Admitting: Internal Medicine

## 2012-08-12 VITALS — BP 196/81 | HR 71 | Temp 98.2°F | Ht 66.0 in | Wt 146.5 lb

## 2012-08-12 DIAGNOSIS — B20 Human immunodeficiency virus [HIV] disease: Secondary | ICD-10-CM

## 2012-08-12 DIAGNOSIS — Z23 Encounter for immunization: Secondary | ICD-10-CM | POA: Diagnosis not present

## 2012-08-12 DIAGNOSIS — H269 Unspecified cataract: Secondary | ICD-10-CM | POA: Diagnosis not present

## 2012-08-12 NOTE — Progress Notes (Signed)
Patient ID: Madison Newman, female   DOB: 09-13-1941, 71 y.o.   MRN: [id number]     [facility] for Infectious Disease  Patient Active Problem List  Diagnosis  . HIV DISEASE  . HYPERLIPIDEMIA  . ANEMIA NEC  . ANXIETY  . IRITIS  . TINNITUS  . HYPERTENSION  . REFLUX ESOPHAGITIS  . BREAST CANCER, HX OF  . LUMPECTOMY, BREAST, HX OF  . TAH/BSO, HX OF  . Cataracts, bilateral    Patient's Medications  New Prescriptions   No medications on file  Previous Medications   ASCORBIC ACID (VITAMIN C) 100 MG TABLET    Take 100 mg by mouth daily.   CALCIUM CARBONATE 200 MG CAPSULE    Take 250 mg by mouth 2 (two) times daily with a meal.   NEBIVOLOL HCL (BYSTOLIC) 20 MG TABS    Take 1 tablet by mouth daily.     SPIRONOLACTONE (ALDACTONE) 12.5 MG TABS    Take 12.5 mg by mouth daily.     VITAMIN D, ERGOCALCIFEROL, (DRISDOL) 50000 UNITS CAPS    Take 50,000 Units by mouth.  Modified Medications   No medications on file  Discontinued Medications   No medications on file    Subjective: Madison is in for her routine visit. Her mother died recently and she states that that was quite a blow to her. However, she does feel like she will have more time to devote to her own health care issues. She still describes numerous barriers to restarting HIV therapy. She is worried about potential side effects and believes that she will not tolerate any regimen that I could put her on. She also has some concerns about being able to afford them although she has a [employer]. She also states that she has a great deal of "hope and faith". I asked her if hope and faith were at odds with taking medications and she stated "yes". She has told me numerous times over the last year that she does not believe she has much longer to live and is not afraid of dying. However she also states today, that she knows that she will eventually need to start on therapy. She does not have anyone as health care  power of attorney.  Objective: Temp: 98.2 F (36.8 C) (10/17 0934) Temp src: Oral (10/17 0934) BP: 196/81 mmHg (10/17 0934) Pulse Rate: 71  (10/17 0934)  General: she is alert and in good spirits Skin: no rash Lungs: clear Cor: regular S1 and S2 no murmurs  Lab Results HIV 1 RNA Quant (copies/mL)  Date Value  07/26/2012 21944*  05/17/2012 24556*  02/10/2012 11914*     CD4 T Cell Abs (cmm)  Date Value  07/26/2012 210*  05/17/2012 200*  02/10/2012 250*     Assessment: Madison remains extremely conflicted about restarting antiretroviral therapy. I've suggested that she go home and write down all the things that she perceives as barriers to restarting therapy then prioritize them and take a hard look at them to see if she really believes she could consider trying medication again. I do not think frequent CD4 and viral loads will be very helpful unless she is willing to reconsider treatment. I suggested that she go one way or the other. His her she needs to restart therapy and allow me to deal with any potential side effects she might have or make it clear that she does not want any life extending therapy and arrange appropriate DO  NOT RESUSCITATE paperwork. I've also suggested that she think of who might be her healthcare power of attorney.  Plan: 1. Observe off of antiretroviral therapy for now 2. Return after repeat CD4 and viral load in 2 months   Cliffton Asters, MD [facility] for Infectious Disease [facility] [phone] pager   [phone] cell 08/12/2012, 10:06 AM

## 2012-08-16 DIAGNOSIS — I10 Essential (primary) hypertension: Secondary | ICD-10-CM | POA: Diagnosis not present

## 2012-08-26 DIAGNOSIS — H43399 Other vitreous opacities, unspecified eye: Secondary | ICD-10-CM | POA: Diagnosis not present

## 2012-08-26 DIAGNOSIS — H2510 Age-related nuclear cataract, unspecified eye: Secondary | ICD-10-CM | POA: Diagnosis not present

## 2012-08-26 DIAGNOSIS — H25019 Cortical age-related cataract, unspecified eye: Secondary | ICD-10-CM | POA: Diagnosis not present

## 2012-09-06 DIAGNOSIS — H25019 Cortical age-related cataract, unspecified eye: Secondary | ICD-10-CM | POA: Diagnosis not present

## 2012-09-20 DIAGNOSIS — H25019 Cortical age-related cataract, unspecified eye: Secondary | ICD-10-CM | POA: Diagnosis not present

## 2012-10-18 ENCOUNTER — Other Ambulatory Visit: Payer: Medicare Other

## 2012-10-18 DIAGNOSIS — B20 Human immunodeficiency virus [HIV] disease: Secondary | ICD-10-CM | POA: Diagnosis not present

## 2012-10-19 LAB — T-HELPER CELL (CD4) - (RCID CLINIC ONLY): CD4 T Cell Abs: 220 uL — ABNORMAL LOW (ref 400–2700)

## 2012-11-02 ENCOUNTER — Encounter: Payer: Self-pay | Admitting: Internal Medicine

## 2012-11-02 ENCOUNTER — Ambulatory Visit (INDEPENDENT_AMBULATORY_CARE_PROVIDER_SITE_OTHER): Payer: Medicare Other | Admitting: Internal Medicine

## 2012-11-02 VITALS — BP 181/82 | HR 71 | Temp 98.0°F | Ht 65.5 in | Wt 147.0 lb

## 2012-11-02 DIAGNOSIS — B20 Human immunodeficiency virus [HIV] disease: Secondary | ICD-10-CM | POA: Diagnosis not present

## 2012-11-02 NOTE — Progress Notes (Signed)
Patient ID: Madison Newman, female   DOB: 03/28/1941, 72 y.o.   MRN: [id number]     [facility] for Infectious Disease  Patient Active Problem List  Diagnosis  . HIV DISEASE  . HYPERLIPIDEMIA  . ANEMIA NEC  . ANXIETY  . IRITIS  . TINNITUS  . HYPERTENSION  . REFLUX ESOPHAGITIS  . BREAST CANCER, HX OF  . LUMPECTOMY, BREAST, HX OF  . TAH/BSO, HX OF  . Cataracts, bilateral    Patient's Medications  New Prescriptions   No medications on file  Previous Medications   ASCORBIC ACID (VITAMIN C) 100 MG TABLET    Take 100 mg by mouth daily.   CALCIUM CARBONATE 200 MG CAPSULE    Take 250 mg by mouth 2 (two) times daily with a meal.   DIFLUPREDNATE (DUREZOL) 0.05 % EMUL    Apply to eye.   NEBIVOLOL HCL (BYSTOLIC) 20 MG TABS    Take 1 tablet by mouth daily.     SPIRONOLACTONE (ALDACTONE) 12.5 MG TABS    Take 12.5 mg by mouth daily.     VITAMIN D, ERGOCALCIFEROL, (DRISDOL) 50000 UNITS CAPS    Take 1,000 Units by mouth daily.   Modified Medications   No medications on file  Discontinued Medications   No medications on file    Subjective: Madison is in for her routine visit. She states that her major concern about restarting antiretroviral therapy as a financial implication. She has 2 International aid/development worker. One of them is with [facility] and the other with [employer]. She is writing a period of transition in her coverage and is worried about increased deductible and co-pays. She states that she is also concerned about the possibility of side effects but also states that she knows that she is nearing the point where she has to restart therapy soon.  Objective: Temp: 98 F (36.7 C) (01/07 0936) Temp src: Oral (01/07 0936) BP: 181/82 mmHg (01/07 0940) Pulse Rate: 71  (01/07 0940)  General: She is in good spirits Skin: No rash Lungs: Clear Cor: Regular S1 and S2 no murmurs  Lab Results HIV 1 RNA Quant (copies/mL)  Date Value  10/18/2012 17533*  07/26/2012 30865*    05/17/2012 24556*     CD4 T Cell Abs (cmm)  Date Value  10/18/2012 220*  07/26/2012 210*  05/17/2012 200*     Assessment: Her financial concerns or a legitimate reason to worry about restarting therapy. However I suggested and she agrees to meet with our financial counselor to better understand what her options for coverage are and what her deductible's and co-pays may be for several different single pill regimens. Co-pay cards may be available which could reduce her financial burden.  Plan: 1. Refer her to our financial counselor and determine what medications offer co-pay cards 2. Followup in 3 weeks   Cliffton Asters, MD [facility] for Infectious Disease [facility] [phone] pager   [phone] cell 11/02/2012, 10:19 AM

## 2012-11-11 ENCOUNTER — Ambulatory Visit: Payer: 59

## 2012-12-14 DIAGNOSIS — E781 Pure hyperglyceridemia: Secondary | ICD-10-CM | POA: Diagnosis not present

## 2012-12-14 DIAGNOSIS — I10 Essential (primary) hypertension: Secondary | ICD-10-CM | POA: Diagnosis not present

## 2012-12-14 DIAGNOSIS — E119 Type 2 diabetes mellitus without complications: Secondary | ICD-10-CM | POA: Diagnosis not present

## 2012-12-14 DIAGNOSIS — E7800 Pure hypercholesterolemia, unspecified: Secondary | ICD-10-CM | POA: Diagnosis not present

## 2013-01-04 ENCOUNTER — Ambulatory Visit: Payer: Medicare Other | Admitting: Internal Medicine

## 2013-01-05 ENCOUNTER — Other Ambulatory Visit: Payer: Medicare Other

## 2013-01-10 ENCOUNTER — Ambulatory Visit (INDEPENDENT_AMBULATORY_CARE_PROVIDER_SITE_OTHER): Payer: 59 | Admitting: Internal Medicine

## 2013-01-10 ENCOUNTER — Encounter: Payer: Self-pay | Admitting: Internal Medicine

## 2013-01-10 VITALS — BP 201/83 | HR 69 | Temp 98.1°F | Ht 65.0 in | Wt 147.0 lb

## 2013-01-10 DIAGNOSIS — B20 Human immunodeficiency virus [HIV] disease: Secondary | ICD-10-CM | POA: Diagnosis not present

## 2013-01-10 NOTE — Progress Notes (Signed)
Patient ID: Madison Newman, female   DOB: Mar 16, 1941, 72 y.o.   MRN: [id number]          [facility] for Infectious Disease  Patient Active Problem List  Diagnosis  . HIV DISEASE  . HYPERLIPIDEMIA  . ANEMIA NEC  . ANXIETY  . IRITIS  . TINNITUS  . HYPERTENSION  . REFLUX ESOPHAGITIS  . BREAST CANCER, HX OF  . LUMPECTOMY, BREAST, HX OF  . TAH/BSO, HX OF  . Cataracts, bilateral    Patient's Medications  New Prescriptions   No medications on file  Previous Medications   ASCORBIC ACID (VITAMIN C) 100 MG TABLET    Take 100 mg by mouth daily.   CALCIUM CARBONATE 200 MG CAPSULE    Take 250 mg by mouth 2 (two) times daily with a meal.   DIFLUPREDNATE (DUREZOL) 0.05 % EMUL    Apply to eye.   NEBIVOLOL HCL (BYSTOLIC) 20 MG TABS    Take 1 tablet by mouth daily.     SPIRONOLACTONE (ALDACTONE) 12.5 MG TABS    Take 12.5 mg by mouth daily.     VITAMIN D, ERGOCALCIFEROL, (DRISDOL) 50000 UNITS CAPS    Take 1,000 Units by mouth daily.   Modified Medications   No medications on file  Discontinued Medications   No medications on file    Subjective: Madison is in for her routine visit. She is somewhat upset that she has not had blood work yet to prepare for this visit.  Objective: Temp: 98.1 F (36.7 C) (03/17 1442) Temp src: Oral (03/17 1442) BP: 201/83 mmHg (03/17 1442) Pulse Rate: 69 (03/17 1442)  General: she is upset but appropriate Skin: no rash  Lab Results HIV 1 RNA Quant (copies/mL)  Date Value  10/18/2012 17533*  07/26/2012 11914*  05/17/2012 24556*     CD4 T Cell Abs (cmm)  Date Value  10/18/2012 220*  07/26/2012 210*  05/17/2012 200*     Assessment: I have reviewed the tremendous of her CD4 count and viral load over the past 5 years as well as her response to her last round of antiretroviral treatment in 2009. At that time she had complete viral suppression while on therapy and her CD4 count doubled to over 400. Her trend off of therapy over the past 5  years has been one of diminishing CD4 count and increasing viral loads. She seems to be leading to her restarting therapy with Complera in the near future but would like to have repeat lab work done today.  Plan: 1. Check CD4 count and HIV viral load 2. Followup in 10 days   Cliffton Asters, MD [facility] for Infectious Disease [facility] [phone] pager   [phone] cell 01/10/2013, 3:12 PM

## 2013-01-11 LAB — T-HELPER CELL (CD4) - (RCID CLINIC ONLY): CD4 T Cell Abs: 270 uL — ABNORMAL LOW (ref 400–2700)

## 2013-01-11 LAB — HIV-1 RNA QUANT-NO REFLEX-BLD: HIV-1 RNA Quant, Log: 4.53 {Log} — ABNORMAL HIGH (ref <1.30)

## 2013-01-14 DIAGNOSIS — I10 Essential (primary) hypertension: Secondary | ICD-10-CM | POA: Diagnosis not present

## 2013-01-14 DIAGNOSIS — R7309 Other abnormal glucose: Secondary | ICD-10-CM | POA: Diagnosis not present

## 2013-01-17 ENCOUNTER — Encounter: Payer: Self-pay | Admitting: Internal Medicine

## 2013-01-18 ENCOUNTER — Ambulatory Visit: Payer: Medicare Other | Admitting: Internal Medicine

## 2013-01-19 ENCOUNTER — Encounter: Payer: Self-pay | Admitting: Internal Medicine

## 2013-01-19 ENCOUNTER — Ambulatory Visit (INDEPENDENT_AMBULATORY_CARE_PROVIDER_SITE_OTHER): Payer: 59 | Admitting: Internal Medicine

## 2013-01-19 VITALS — BP 187/65 | HR 73 | Temp 98.3°F | Wt 148.0 lb

## 2013-01-19 DIAGNOSIS — B20 Human immunodeficiency virus [HIV] disease: Secondary | ICD-10-CM | POA: Diagnosis not present

## 2013-01-19 MED ORDER — EMTRICITAB-RILPIVIR-TENOFOV DF 200-25-300 MG PO TABS: 1.0000 | ORAL_TABLET | Freq: Every day | ORAL | Status: DC

## 2013-01-20 NOTE — Progress Notes (Signed)
Patient ID: Madison Newman, female   DOB: June 20, 1941, 72 y.o.   MRN: [id number]          [facility] for Infectious Disease  Patient Active Problem List  Diagnosis  . HIV DISEASE  . HYPERLIPIDEMIA  . ANEMIA NEC  . ANXIETY  . IRITIS  . TINNITUS  . HYPERTENSION  . REFLUX ESOPHAGITIS  . BREAST CANCER, HX OF  . LUMPECTOMY, BREAST, HX OF  . TAH/BSO, HX OF  . Cataracts, bilateral    Patient's Medications  New Prescriptions   EMTRICITAB-RILPIVIR-TENOFOVIR 200-25-300 MG TABS    Take 1 tablet by mouth daily.  Previous Medications   ASCORBIC ACID (VITAMIN C) 100 MG TABLET    Take 100 mg by mouth daily.   CALCIUM CARBONATE 200 MG CAPSULE    Take 250 mg by mouth 2 (two) times daily with a meal.   DIFLUPREDNATE (DUREZOL) 0.05 % EMUL    Apply to eye.   NEBIVOLOL HCL (BYSTOLIC) 20 MG TABS    Take 1 tablet by mouth daily.     SPIRONOLACTONE (ALDACTONE) 12.5 MG TABS    Take 12.5 mg by mouth daily.     VITAMIN D, ERGOCALCIFEROL, (DRISDOL) 50000 UNITS CAPS    Take 1,000 Units by mouth daily.   Modified Medications   No medications on file  Discontinued Medications   No medications on file    Subjective: Madison is in for her routine followup visit. She is still very conflicted about the decision as to whether she will restart antiretroviral therapy. She says that she feels tortured by the decision and gets anxious when she comes in to review her most recent blood work but also feels equally anxious if she does any length of time without having repeat blood work. She states that she believes that prior antiretroviral regimens made her feel much worse and even caused her breast cancer. She says that she read about Complera after her last visit and noted that it causes fatty liver which would kill her.  Objective:    General: anxious Skin: no rash Lungs: clear Cor: regular S1 and S2 and no murmurs  Lab Results HIV 1 RNA Quant (copies/mL)  Date Value  01/10/2013 33571*    10/18/2012 17533*  07/26/2012 40981*     CD4 T Cell Abs (cmm)  Date Value  01/10/2013 270*  10/18/2012 220*  07/26/2012 210*     Assessment: Madison is having a great deal of difficulty deciding whether or not to restart antiretroviral therapy. She is heavily biased toward all antiretroviral medications been toxic. However, she insists that she is considering restarting therapy. She is still worried about the financial implications. I will go ahead and prescribe of Complera so that we can determine what the financial implications would be if she started back. I will also have her return in one month to speak with our pharmacist so she can get their input about potential side effects of various regimens. She agrees with that plan.  Plan: 1. Determine financial implications of starting Complera 2. Followup in one month   Cliffton Asters, MD [facility] for Infectious Disease [facility] [phone] pager   [phone] cell 01/20/2013, 9:34 AM

## 2013-02-04 ENCOUNTER — Other Ambulatory Visit: Payer: Self-pay | Admitting: *Deleted

## 2013-02-04 DIAGNOSIS — B20 Human immunodeficiency virus [HIV] disease: Secondary | ICD-10-CM

## 2013-02-04 MED ORDER — EMTRICITAB-RILPIVIR-TENOFOV DF 200-25-300 MG PO TABS: 1.0000 | ORAL_TABLET | Freq: Every day | ORAL | Status: DC

## 2013-02-08 ENCOUNTER — Telehealth: Payer: Self-pay

## 2013-02-08 ENCOUNTER — Other Ambulatory Visit: Payer: Self-pay | Admitting: *Deleted

## 2013-02-08 DIAGNOSIS — B20 Human immunodeficiency virus [HIV] disease: Secondary | ICD-10-CM

## 2013-02-08 MED ORDER — EMTRICITAB-RILPIVIR-TENOFOV DF 200-25-300 MG PO TABS: 1.0000 | ORAL_TABLET | Freq: Every day | ORAL | Status: DC

## 2013-02-08 NOTE — Telephone Encounter (Signed)
Left message with patient that she was approved for SPAP - also left [facility] phone number in case they had not called her, yet, to set-up delivery or pick-up.

## 2013-02-15 ENCOUNTER — Encounter: Payer: Self-pay | Admitting: Internal Medicine

## 2013-02-15 ENCOUNTER — Ambulatory Visit (INDEPENDENT_AMBULATORY_CARE_PROVIDER_SITE_OTHER): Payer: Medicare Other | Admitting: Internal Medicine

## 2013-02-15 VITALS — BP 209/69 | HR 70 | Temp 98.2°F | Ht 65.5 in | Wt 147.5 lb

## 2013-02-15 DIAGNOSIS — B20 Human immunodeficiency virus [HIV] disease: Secondary | ICD-10-CM | POA: Diagnosis not present

## 2013-02-15 NOTE — Progress Notes (Signed)
Patient ID: Madison Newman, female   DOB: [birth year], 72 y.o.   MRN: [id number]          [facility] for Infectious Disease  Patient Active Problem List  Diagnosis  . HIV DISEASE  . HYPERLIPIDEMIA  . ANEMIA NEC  . ANXIETY  . IRITIS  . TINNITUS  . HYPERTENSION  . REFLUX ESOPHAGITIS  . BREAST CANCER, HX OF  . LUMPECTOMY, BREAST, HX OF  . TAH/BSO, HX OF  . Cataracts, bilateral    Patient's Medications  New Prescriptions   No medications on file  Previous Medications   ASCORBIC ACID (VITAMIN C) 100 MG TABLET    Take 100 mg by mouth daily.   CALCIUM CARBONATE 200 MG CAPSULE    Take 250 mg by mouth 2 (two) times daily with a meal.   DIFLUPREDNATE (DUREZOL) 0.05 % EMUL    Apply to eye.   EMTRICITAB-RILPIVIR-TENOFOVIR 200-25-300 MG TABS    Take 1 tablet by mouth daily.   NEBIVOLOL HCL (BYSTOLIC) 20 MG TABS    Take 1 tablet by mouth daily.     SPIRONOLACTONE (ALDACTONE) 12.5 MG TABS    Take 12.5 mg by mouth daily.     VITAMIN D, ERGOCALCIFEROL, (DRISDOL) 50000 UNITS CAPS    Take 1,000 Units by mouth daily.   Modified Medications   No medications on file  Discontinued Medications   No medications on file    Subjective: Madison is in for her routine followup visit to discuss restarting antiretroviral therapy and meet with Ulyses Southward, our pharmacist. She now recalls that taking her Atripla was "not too bad".  Objective: Temp: 98.2 F (36.8 C) (04/22 0906) Temp src: Oral (04/22 0906) BP: 209/69 mmHg (04/22 0906) Pulse Rate: 70 (04/22 0906)  General: She is in good spirits Skin: No rash Lungs: Clear Cor: Regular S1 and S2 no murmurs Abdomen: Soft and nontender  Lab Results HIV 1 RNA Quant (copies/mL)  Date Value  01/10/2013 33571*  10/18/2012 17533*  07/26/2012 14782*     CD4 T Cell Abs (cmm)  Date Value  01/10/2013 270*  10/18/2012 220*  07/26/2012 210*     Assessment: She is now willing to consider taking Complera or a combination of Truvada and Tivicay  after speaking with Ulyses Southward. I encouraged her to call her pharmacist to inquire about the financial implications of starting antiretroviral therapy given her current insurance.  Plan: 1. Followup to consider antiretroviral therapy in 2 weeks   Cliffton Asters, MD [facility] for Infectious Disease [facility] [phone] pager   [phone] cell 02/15/2013, 9:42 AM

## 2013-02-21 NOTE — Progress Notes (Signed)
HPI: [name] W Kincaid is a 72 y.o. female with HIV who is here for her follow up visit  Allergies: No Known Allergies  Vitals:    Past Medical History: No past medical history on file.  Social History: History   Social History  . Marital Status: Divorced    Spouse Name: N/A    Number of Children: N/A  . Years of Education: N/A   Social History Main Topics  . Smoking status: Never Smoker   . Smokeless tobacco: Never Used  . Alcohol Use: No  . Drug Use: No  . Sexually Active: No     Comment: declined condons   Other Topics Concern  . None   Social History Narrative  . None    Current Regimen: None  Labs: HIV 1 RNA Quant (copies/mL)  Date Value  01/10/2013 33571*  10/18/2012 17533*  07/26/2012 16109*     CD4 T Cell Abs (cmm)  Date Value  01/10/2013 270*  10/18/2012 220*  07/26/2012 210*     Hep B S Ab (no units)  Date Value  12/21/2006 No      Hepatitis B Surface Ag (no units)  Date Value  12/21/2006 No      HCV Ab (no units)  Date Value  12/21/2006 No     CrCl: Estimated Creatinine Clearance: 61 ml/min (by C-G formula based on Cr of 0.8).  Lipids:    Component Value Date/Time   CHOL 160 11/11/2011 0900   TRIG 56 11/11/2011 0900   HDL 43 11/11/2011 0900   CHOLHDL 3.7 11/11/2011 0900   VLDL 11 11/11/2011 0900   LDLCALC 106* 11/11/2011 0900    Assessment: Ms Cozad is here for her routine visit. She has been off of meds for a while now. Her VL was over 33k and her CD4 was around 270. Dr. Orvan Falconer has tried several times to get her to take meds but she has refused. I spent nearly an hour with her. I went over everything I could think off and answered all of her questions. When it came time to make a decision, she still wasn't ready to do it. I gave her somewhat an ultimatum to make a decision in 2 weeks. I gave her two choices, either complera or Tivicay + Truvada. I really hope she can make a decision. She was afraid of the side effects of the meds.    Recommendations: Allow pt to do research on the meds and see her back in a few weeks.   Clide Cliff, PharmD Clinical Infectious Disease Pharmacist [facility] for Infectious Disease 02/21/2013, 11:29 PM

## 2013-03-02 ENCOUNTER — Telehealth: Payer: Self-pay | Admitting: *Deleted

## 2013-03-02 NOTE — Telephone Encounter (Signed)
Thanks Michelle

## 2013-03-02 NOTE — Telephone Encounter (Signed)
Patient called to reschedule her appointment for 03/03/13, stating that she had to leave town to take care of a matter in her mother's estate.  Patient stated that she wasn't entirely sure why she was coming tomorrow anyway, as she was in the middle of a project and didn't want to start medication until "after the 21st."  She stated she was aware that she was supposed to come on a Tuesday to meet with the pharmacist,  Ulyses Southward, but stated that Tuesdays will not work for her.  Patient requested to be scheduled on a Thursday after the 21st.  Appointment given for 03/24/13 at 9:00.   Andree Coss, RN

## 2013-03-03 ENCOUNTER — Ambulatory Visit: Payer: 59 | Admitting: Internal Medicine

## 2013-03-24 ENCOUNTER — Encounter: Payer: Self-pay | Admitting: Internal Medicine

## 2013-03-24 ENCOUNTER — Ambulatory Visit (INDEPENDENT_AMBULATORY_CARE_PROVIDER_SITE_OTHER): Payer: Medicare Other | Admitting: Internal Medicine

## 2013-03-24 VITALS — BP 212/79 | HR 66 | Temp 98.4°F | Ht 65.5 in | Wt 144.8 lb

## 2013-03-24 DIAGNOSIS — B20 Human immunodeficiency virus [HIV] disease: Secondary | ICD-10-CM

## 2013-03-24 NOTE — Progress Notes (Signed)
Patient ID: Madison Newman, female   DOB: Jul 12, 1941, 72 y.o.   MRN: [id number]          [facility] for Infectious Disease  Patient Active Problem List   Diagnosis Date Noted  . Cataracts, bilateral 08/12/2012  . 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 ANXIETY 12/07/2007  . LUMPECTOMY, BREAST, HX OF 12/16/2006  . TAH/BSO, HX OF 12/16/2006  . HIV DISEASE 12/14/2006  . HYPERLIPIDEMIA 12/14/2006  . ANEMIA NEC 12/14/2006  . IRITIS 12/14/2006  . HYPERTENSION 12/14/2006  . REFLUX ESOPHAGITIS 12/14/2006  . BREAST CANCER, HX OF 12/14/2006    Patient's Medications  New Prescriptions   No medications on file  Previous Medications   ASCORBIC ACID (VITAMIN C) 100 MG TABLET    Take 100 mg by mouth daily.   CALCIUM CARBONATE 200 MG CAPSULE    Take 250 mg by mouth 2 (two) times daily with a meal.   DIFLUPREDNATE (DUREZOL) 0.05 % EMUL    Apply to eye.   EMTRICITAB-RILPIVIR-TENOFOVIR 200-25-300 MG TABS    Take 1 tablet by mouth daily.   NEBIVOLOL HCL (BYSTOLIC) 20 MG TABS    Take 1 tablet by mouth daily.     SPIRONOLACTONE (ALDACTONE) 12.5 MG TABS    Take 12.5 mg by mouth daily.     VITAMIN D, ERGOCALCIFEROL, (DRISDOL) 50000 UNITS CAPS    Take 1,000 Units by mouth daily.   Modified Medications   No medications on file  Discontinued Medications   No medications on file    Subjective: Madison is in for her routine visit. She checked with her pharmacy and found that she would have no co-pay for Complera if she decided to start it. She says that she is now convinced in no she will need to restart medication for her HIV infection but would like to recheck her lab work one more time today before starting.  Review of Systems: She is feeling perfectly well and denies any complaints  No past medical history on file.  History  Substance Use Topics  . Smoking status: Never Smoker   . Smokeless tobacco: Never Used  . Alcohol Use: No    No family history on file.  No Known  Allergies  Objective: Temp: 98.4 F (36.9 C) (05/29 0904) Temp src: Oral (05/29 0904) BP: 212/79 mmHg (05/29 0904) Pulse Rate: 66 (05/29 0904)  General: She is well dressed and in good spirits as usual Oral: No oropharyngeal lesions Skin: No rash Lungs: Clear Cor: Regular S1 and S2 no murmurs  Lab Results HIV 1 RNA Quant (copies/mL)  Date Value  01/10/2013 33571*  10/18/2012 17533*  07/26/2012 40981*     CD4 T Cell Abs (cmm)  Date Value  01/10/2013 270*  10/18/2012 220*  07/26/2012 210*     Assessment: She is beginning to come around and seems much more willing to consider restarting antiretroviral therapy.  Plan: 1. Recheck CD4 and viral load today 2. Followup in 2 weeks   Cliffton Asters, MD [facility] for Infectious Disease [facility] [phone] pager   [phone] cell 03/24/2013, 9:21 AM

## 2013-03-25 LAB — HIV-1 RNA QUANT-NO REFLEX-BLD: HIV 1 RNA Quant: 9198 copies/mL — ABNORMAL HIGH (ref <20)

## 2013-03-25 LAB — T-HELPER CELL (CD4) - (RCID CLINIC ONLY)
CD4 % Helper T Cell: 15 % — ABNORMAL LOW (ref 33–55)
CD4 T Cell Abs: 190 uL — ABNORMAL LOW (ref 400–2700)

## 2013-04-07 ENCOUNTER — Encounter: Payer: Self-pay | Admitting: Internal Medicine

## 2013-04-07 ENCOUNTER — Ambulatory Visit (INDEPENDENT_AMBULATORY_CARE_PROVIDER_SITE_OTHER): Payer: Medicare Other | Admitting: Internal Medicine

## 2013-04-07 VITALS — BP 186/83 | HR 60 | Temp 98.4°F | Ht 65.5 in | Wt 147.0 lb

## 2013-04-07 DIAGNOSIS — B20 Human immunodeficiency virus [HIV] disease: Secondary | ICD-10-CM | POA: Diagnosis not present

## 2013-04-07 NOTE — Progress Notes (Signed)
Patient ID: Madison Newman, female   DOB: November 07, 1940, 72 y.o.   MRN: [id number]          [facility] for Infectious Disease  Patient Active Problem List   Diagnosis Date Noted  . Cataracts, bilateral 08/12/2012  . 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 ANXIETY 12/07/2007  . LUMPECTOMY, BREAST, HX OF 12/16/2006  . TAH/BSO, HX OF 12/16/2006  . HIV DISEASE 12/14/2006  . HYPERLIPIDEMIA 12/14/2006  . ANEMIA NEC 12/14/2006  . IRITIS 12/14/2006  . HYPERTENSION 12/14/2006  . REFLUX ESOPHAGITIS 12/14/2006  . BREAST CANCER, HX OF 12/14/2006    Patient's Medications  New Prescriptions   No medications on file  Previous Medications   ASCORBIC ACID (VITAMIN C) 100 MG TABLET    Take 100 mg by mouth daily.   CALCIUM CARBONATE 200 MG CAPSULE    Take 250 mg by mouth 2 (two) times daily with a meal.   DIFLUPREDNATE (DUREZOL) 0.05 % EMUL    Apply to eye.   EMTRICITAB-RILPIVIR-TENOFOVIR 200-25-300 MG TABS    Take 1 tablet by mouth daily.   NEBIVOLOL HCL (BYSTOLIC) 20 MG TABS    Take 1 tablet by mouth daily.     SPIRONOLACTONE (ALDACTONE) 12.5 MG TABS    Take 12.5 mg by mouth daily.     VITAMIN D, ERGOCALCIFEROL, (DRISDOL) 50000 UNITS CAPS    Take 1,000 Units by mouth daily.   Modified Medications   No medications on file  Discontinued Medications   No medications on file    Subjective: Madison is in for her routine visit. She checked her latest lab results on MyChart and has decided to restart antiretroviral therapy. She is already ordered Complera next Bextra receive it within the next 3-4 days.  Review of Systems: Pertinent items are noted in HPI.  No past medical history on file.  History  Substance Use Topics  . Smoking status: Never Smoker   . Smokeless tobacco: Never Used  . Alcohol Use: No    No family history on file.  No Known Allergies  Objective: Temp: 98.4 F (36.9 C) (06/12 1534) Temp src: Oral (06/12 1534) BP: 186/83 mmHg (06/12 1536) Pulse Rate: 60 (06/12  1534)  General: She is in good spirits Lungs: Clear Cor: Regular S1 and S2 no murmurs Abdomen: Nontender Mood and affect normal  Lab Results HIV 1 RNA Quant (copies/mL)  Date Value  03/24/2013 9198*  01/10/2013 33571*  10/18/2012 17533*     CD4 T Cell Abs (cmm)  Date Value  03/24/2013 190*  01/10/2013 270*  10/18/2012 220*     Lab Results  Component Value Date   WBC 3.4* 02/10/2012   HGB 10.6* 02/10/2012   HCT 33.9* 02/10/2012   MCV 88.3 02/10/2012   PLT 248 02/10/2012   BMET    Component Value Date/Time   NA 134* 02/10/2012 0906   K 4.1 02/10/2012 0906   CL 101 02/10/2012 0906   CO2 28 02/10/2012 0906   GLUCOSE 70 02/10/2012 0906   BUN 15 02/10/2012 0906   CREATININE 0.80 02/10/2012 0906   CREATININE 0.83 01/07/2011 1748   CALCIUM 9.7 02/10/2012 0906   Lab Results  Component Value Date   ALT 14 02/10/2012   AST 23 02/10/2012   ALKPHOS 66 02/10/2012   BILITOT 0.3 02/10/2012   Lab Results  Component Value Date   CHOL 160 11/11/2011   HDL 43 11/11/2011   LDLCALC 106* 11/11/2011   TRIG 56 11/11/2011   CHOLHDL 3.7 11/11/2011  Assessment: She is ready to restart therapy  Plan: 1. Start Complera soon. She knows to take it with a full meal and avoid any antacids or proton pump inhibitors 2. Followup after lab work in 6 weeks   Cliffton Asters, MD [facility] for Infectious Disease [facility] [phone] pager   [phone] cell 04/07/2013, 4:00 PM

## 2013-04-11 NOTE — Addendum Note (Signed)
Addended by: Jennet Maduro D on: 04/11/2013 08:41 AM   Modules accepted: Orders

## 2013-04-15 DIAGNOSIS — C113 Malignant neoplasm of anterior wall of nasopharynx: Secondary | ICD-10-CM | POA: Diagnosis not present

## 2013-04-15 DIAGNOSIS — B20 Human immunodeficiency virus [HIV] disease: Secondary | ICD-10-CM | POA: Diagnosis not present

## 2013-04-15 DIAGNOSIS — I10 Essential (primary) hypertension: Secondary | ICD-10-CM | POA: Diagnosis not present

## 2013-04-28 ENCOUNTER — Encounter: Payer: Self-pay | Admitting: Internal Medicine

## 2013-04-28 DIAGNOSIS — E119 Type 2 diabetes mellitus without complications: Secondary | ICD-10-CM | POA: Diagnosis not present

## 2013-04-28 DIAGNOSIS — B20 Human immunodeficiency virus [HIV] disease: Secondary | ICD-10-CM | POA: Diagnosis not present

## 2013-04-28 DIAGNOSIS — K1120 Sialoadenitis, unspecified: Secondary | ICD-10-CM | POA: Diagnosis not present

## 2013-05-05 NOTE — Addendum Note (Signed)
Addended by: Jennet Maduro D on: 05/05/2013 03:51 PM   Modules accepted: Orders

## 2013-05-10 ENCOUNTER — Telehealth: Payer: Self-pay | Admitting: *Deleted

## 2013-05-10 NOTE — Telephone Encounter (Signed)
Please moved to [name] his next visit up to Tuesday, August 5 so I can review her current situation and concerns about possible side effects of Complera.

## 2013-05-10 NOTE — Telephone Encounter (Signed)
Madison Newman can you please look at the email and respond to Madison Newman's concerns. Madison Newman

## 2013-05-17 ENCOUNTER — Encounter: Payer: Self-pay | Admitting: Internal Medicine

## 2013-05-18 ENCOUNTER — Encounter: Payer: Self-pay | Admitting: Internal Medicine

## 2013-05-18 ENCOUNTER — Telehealth: Payer: Self-pay | Admitting: *Deleted

## 2013-05-18 ENCOUNTER — Ambulatory Visit (INDEPENDENT_AMBULATORY_CARE_PROVIDER_SITE_OTHER): Payer: Medicare Other | Admitting: Internal Medicine

## 2013-05-18 VITALS — BP 186/68 | HR 88 | Temp 102.7°F | Wt 176.0 lb

## 2013-05-18 DIAGNOSIS — B20 Human immunodeficiency virus [HIV] disease: Secondary | ICD-10-CM

## 2013-05-18 DIAGNOSIS — L989 Disorder of the skin and subcutaneous tissue, unspecified: Secondary | ICD-10-CM

## 2013-05-18 LAB — CBC WITH DIFFERENTIAL/PLATELET
Basophils Relative: 0 % (ref 0–1)
HCT: 29.8 % — ABNORMAL LOW (ref 36.0–46.0)
Hemoglobin: 10.1 g/dL — ABNORMAL LOW (ref 12.0–15.0)
Lymphs Abs: 1.5 10*3/uL (ref 0.7–4.0)
MCH: 27.8 pg (ref 26.0–34.0)
MCHC: 33.9 g/dL (ref 30.0–36.0)
Monocytes Absolute: 1.4 10*3/uL — ABNORMAL HIGH (ref 0.1–1.0)
Monocytes Relative: 9 % (ref 3–12)
Neutro Abs: 12.8 10*3/uL — ABNORMAL HIGH (ref 1.7–7.7)
Neutrophils Relative %: 82 % — ABNORMAL HIGH (ref 43–77)
RBC: 3.63 MIL/uL — ABNORMAL LOW (ref 3.87–5.11)

## 2013-05-18 NOTE — Telephone Encounter (Signed)
Called the patient to advised her of the appts made for her for Radiology at 930 am at [facility] and for ENT at [facility] E[facility] at 3 pm. Advised her to the number at ENT [phone] address [address] With Dr Jenne Pane and to give them a call if she need further directions and to call us if she has any questions.

## 2013-05-19 ENCOUNTER — Encounter: Payer: Self-pay | Admitting: Internal Medicine

## 2013-05-19 ENCOUNTER — Encounter: Payer: Self-pay | Admitting: *Deleted

## 2013-05-19 ENCOUNTER — Ambulatory Visit (HOSPITAL_COMMUNITY)
Admission: RE | Admit: 2013-05-19 | Discharge: 2013-05-19 | Disposition: A | Discharge status: Home / Self Care | Payer: Medicare Other | Source: Ambulatory Visit | Attending: Internal Medicine | Admitting: Internal Medicine

## 2013-05-19 ENCOUNTER — Encounter (HOSPITAL_COMMUNITY): Payer: Self-pay

## 2013-05-19 DIAGNOSIS — Z21 Asymptomatic human immunodeficiency virus [HIV] infection status: Secondary | ICD-10-CM | POA: Diagnosis not present

## 2013-05-19 DIAGNOSIS — D497 Neoplasm of unspecified behavior of endocrine glands and other parts of nervous system: Secondary | ICD-10-CM | POA: Diagnosis not present

## 2013-05-19 DIAGNOSIS — R221 Localized swelling, mass and lump, neck: Secondary | ICD-10-CM | POA: Diagnosis not present

## 2013-05-19 DIAGNOSIS — K113 Abscess of salivary gland: Secondary | ICD-10-CM | POA: Diagnosis not present

## 2013-05-19 DIAGNOSIS — R599 Enlarged lymph nodes, unspecified: Secondary | ICD-10-CM | POA: Diagnosis not present

## 2013-05-19 DIAGNOSIS — K1120 Sialoadenitis, unspecified: Secondary | ICD-10-CM | POA: Diagnosis not present

## 2013-05-19 DIAGNOSIS — R220 Localized swelling, mass and lump, head: Secondary | ICD-10-CM | POA: Diagnosis not present

## 2013-05-19 DIAGNOSIS — L989 Disorder of the skin and subcutaneous tissue, unspecified: Secondary | ICD-10-CM | POA: Insufficient documentation

## 2013-05-19 HISTORY — DX: Essential (primary) hypertension: I10

## 2013-05-19 LAB — COMPLETE METABOLIC PANEL WITH GFR
ALT: 29 U/L (ref 0–35)
Alkaline Phosphatase: 79 U/L (ref 39–117)
Creat: 1.12 mg/dL — ABNORMAL HIGH (ref 0.50–1.10)
GFR, Est African American: 58 mL/min — ABNORMAL LOW
GFR, Est Non African American: 50 mL/min — ABNORMAL LOW
Sodium: 125 mEq/L — ABNORMAL LOW (ref 135–145)
Total Bilirubin: 0.6 mg/dL (ref 0.3–1.2)
Total Protein: 8.7 g/dL — ABNORMAL HIGH (ref 6.0–8.3)

## 2013-05-19 LAB — HIV-1 RNA QUANT-NO REFLEX-BLD: HIV-1 RNA Quant, Log: 2.18 {Log} — ABNORMAL HIGH (ref <1.30)

## 2013-05-19 MED ORDER — IOHEXOL 300 MG/ML  SOLN
75.0000 mL | Freq: Once | INTRAMUSCULAR | Status: AC | PRN
  Administered 2013-05-19: 75 mL via INTRAVENOUS

## 2013-05-19 NOTE — Assessment & Plan Note (Signed)
This seems to be a cystic structure and confirmed with a CT.  She will see ENT.  Will use clindamycin after ENT visit.

## 2013-05-19 NOTE — Progress Notes (Signed)
  Subjective:    Patient ID: Madison Newman, female    DOB: 31-Mar-1941, 72 y.o.   MRN: [id number]  HPI She comes in for a work in visit.  She started Complera 37 days ago and has been taking this daily.  She developed a small know on the right side of her check about 1 week ago and yesterday it grew more quickly.  It is painful and now associated with a fever.  No rash.  There has been some drainage from it and some bleeding.  No difficulty swallowing or breathing.     Review of Systems  Constitutional: Positive for fever. Negative for fatigue and unexpected weight change.  HENT: Negative for hearing loss, sore throat and mouth sores.   Respiratory: Negative for shortness of breath and stridor.   Cardiovascular: Negative for chest pain.  Gastrointestinal: Negative for nausea and diarrhea.  Musculoskeletal: Negative for myalgias and arthralgias.  Skin: Negative for rash.  Neurological: Negative for dizziness and headaches.  Hematological: Negative for adenopathy.  Psychiatric/Behavioral: Negative for dysphoric mood.       Objective:   Physical Exam  Constitutional: She appears well-developed and well-nourished.  HENT:  Mouth/Throat: No oropharyngeal exudate.  + 6-7 cm cystic structure at area of parotid gland, tender, no erythema  Eyes: Right eye exhibits no discharge. Left eye exhibits no discharge. No scleral icterus.  Lymphadenopathy:    She has no cervical adenopathy.  Skin: No rash noted.          Assessment & Plan:

## 2013-05-20 LAB — T-HELPER CELL (CD4) - (RCID CLINIC ONLY)
CD4 % Helper T Cell: 12 % — ABNORMAL LOW (ref 33–55)
CD4 T Cell Abs: 200 uL — ABNORMAL LOW (ref 400–2700)

## 2013-05-24 ENCOUNTER — Encounter: Payer: Self-pay | Admitting: Internal Medicine

## 2013-05-24 ENCOUNTER — Ambulatory Visit (INDEPENDENT_AMBULATORY_CARE_PROVIDER_SITE_OTHER): Payer: Medicare Other | Admitting: Internal Medicine

## 2013-05-24 VITALS — BP 187/75 | HR 72 | Temp 97.8°F | Ht 63.0 in | Wt 143.5 lb

## 2013-05-24 DIAGNOSIS — B20 Human immunodeficiency virus [HIV] disease: Secondary | ICD-10-CM | POA: Diagnosis not present

## 2013-05-24 NOTE — Progress Notes (Signed)
Patient ID: Madison Newman, female   DOB: [birth year], 72 y.o.   MRN: [id number]          [facility] for Infectious Disease  Patient Active Problem List   Diagnosis Date Noted  . Face lesion 05/19/2013  . Cataracts, bilateral 08/12/2012  . 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 ANXIETY 12/07/2007  . LUMPECTOMY, BREAST, HX OF 12/16/2006  . TAH/BSO, HX OF 12/16/2006  . HIV DISEASE 12/14/2006  . HYPERLIPIDEMIA 12/14/2006  . ANEMIA NEC 12/14/2006  . IRITIS 12/14/2006  . HYPERTENSION 12/14/2006  . REFLUX ESOPHAGITIS 12/14/2006  . BREAST CANCER, HX OF 12/14/2006    Patient's Medications  New Prescriptions   No medications on file  Previous Medications   ASCORBIC ACID (VITAMIN C) 100 MG TABLET    Take 100 mg by mouth daily.   CALCIUM CARBONATE 200 MG CAPSULE    Take 250 mg by mouth 2 (two) times daily with a meal.   CLINDAMYCIN (CLEOCIN) 300 MG CAPSULE    Take 300 mg by mouth 3 (three) times daily.   DIFLUPREDNATE (DUREZOL) 0.05 % EMUL    Apply to eye.   EMTRICITAB-RILPIVIR-TENOFOVIR 200-25-300 MG TABS    Take 1 tablet by mouth daily.   NEBIVOLOL HCL (BYSTOLIC) 20 MG TABS    Take 1 tablet by mouth daily.     SPIRONOLACTONE (ALDACTONE) 12.5 MG TABS    Take 12.5 mg by mouth daily.     VITAMIN D, ERGOCALCIFEROL, (DRISDOL) 50000 UNITS CAPS    Take 1,000 Units by mouth daily.   Modified Medications   No medications on file  Discontinued Medications   No medications on file    Subjective: Madison is in for her followup visit. Before starting Complera she noted that she was having lots of problems with dry mouth and dry throat. After starting Complera she began to notice some right parotid gland swelling and became extremely painful. An MRI scan showed a combination of solid and cystic enlargement. She was referred to Dr. Jenne Pane and by the time she had tears the lesion was already starting to drain spontaneously. He incised and drained the lesion in his office. No staining or culture results are  available. She started on clindamycin and is feeling better but still has lots of concerns about the relationship of her current condition with potential side effects of Complera. She states that she is feeling unwell. Review of Systems: Pertinent items are noted in HPI.  Past Medical History  Diagnosis Date  . Hypertension     History  Substance Use Topics  . Smoking status: Never Smoker   . Smokeless tobacco: Never Used  . Alcohol Use: No    No family history on file.  No Known Allergies  Objective: Temp: 97.8 F (36.6 C) (07/29 0839) Temp src: Oral (07/29 0839) BP: 187/75 mmHg (07/29 0839) Pulse Rate: 72 (07/29 0839)  General: She seems to objective Oral: Moist the posterior Skin: No rash Lungs: Clear Cor: Regular S1 and S2 and no murmurs She has a clean dry gauze dressing over her right parotid gland  Lab Results HIV 1 RNA Quant (copies/mL)  Date Value  05/18/2013 153*  03/24/2013 9198*  01/10/2013 33571*     CD4 T Cell Abs (cmm)  Date Value  05/18/2013 200*  03/24/2013 190*  01/10/2013 270*     Assessment: Sounds like she developed a right parotid abscess. I will continue clindamycin to locate culture results. Complera has given her good far allergic response but, as  expected, she has many reservations about potential side effects from antiretroviral therapy and is not sure she will be able to continue it. She grudgingly agrees to continue it until her followup visit with me next month. I told her that I suspect that the abscess is making her feel bad and I expect she will feel better once he has resolved.  Plan: 1. Continue Complera and clindamycin 2. Followup after lab work next month 3. She refuses followup lab work today    Cliffton Asters, MD [facility] for Infectious Disease [facility] [phone] pager   [phone] cell 05/24/2013, 9:06 AM

## 2013-05-30 ENCOUNTER — Encounter: Payer: Self-pay | Admitting: Internal Medicine

## 2013-06-01 ENCOUNTER — Other Ambulatory Visit: Payer: Self-pay

## 2013-06-01 DIAGNOSIS — K113 Abscess of salivary gland: Secondary | ICD-10-CM | POA: Diagnosis not present

## 2013-06-06 ENCOUNTER — Ambulatory Visit: Payer: Medicare Other

## 2013-06-06 ENCOUNTER — Other Ambulatory Visit: Payer: Medicare Other

## 2013-06-06 DIAGNOSIS — B20 Human immunodeficiency virus [HIV] disease: Secondary | ICD-10-CM

## 2013-06-06 LAB — CBC
Hemoglobin: 10.3 g/dL — ABNORMAL LOW (ref 12.0–15.0)
MCH: 27.8 pg (ref 26.0–34.0)
MCHC: 32.6 g/dL (ref 30.0–36.0)
RDW: 15.5 % (ref 11.5–15.5)

## 2013-06-06 LAB — COMPREHENSIVE METABOLIC PANEL
ALT: 18 U/L (ref 0–35)
Albumin: 3.8 g/dL (ref 3.5–5.2)
BUN: 19 mg/dL (ref 6–23)
Calcium: 10.3 mg/dL (ref 8.4–10.5)
Glucose, Bld: 71 mg/dL (ref 70–99)
Sodium: 136 mEq/L (ref 135–145)

## 2013-06-07 LAB — T-HELPER CELL (CD4) - (RCID CLINIC ONLY): CD4 T Cell Abs: 160 uL — ABNORMAL LOW (ref 400–2700)

## 2013-06-07 LAB — HIV-1 RNA QUANT-NO REFLEX-BLD
HIV 1 RNA Quant: 34 copies/mL — ABNORMAL HIGH (ref <20)
HIV-1 RNA Quant, Log: 1.53 {Log} — ABNORMAL HIGH (ref <1.30)

## 2013-06-10 ENCOUNTER — Other Ambulatory Visit: Payer: Self-pay | Admitting: *Deleted

## 2013-06-10 ENCOUNTER — Encounter: Payer: Self-pay | Admitting: Internal Medicine

## 2013-06-13 ENCOUNTER — Encounter: Payer: Self-pay | Admitting: Internal Medicine

## 2013-06-14 ENCOUNTER — Ambulatory Visit (INDEPENDENT_AMBULATORY_CARE_PROVIDER_SITE_OTHER): Payer: Medicare Other | Admitting: Internal Medicine

## 2013-06-14 ENCOUNTER — Encounter: Payer: Self-pay | Admitting: Internal Medicine

## 2013-06-14 VITALS — BP 181/73 | HR 69 | Temp 98.5°F | Ht 65.5 in | Wt 144.0 lb

## 2013-06-14 DIAGNOSIS — B20 Human immunodeficiency virus [HIV] disease: Secondary | ICD-10-CM | POA: Diagnosis not present

## 2013-06-14 DIAGNOSIS — Z23 Encounter for immunization: Secondary | ICD-10-CM | POA: Diagnosis not present

## 2013-06-14 NOTE — Addendum Note (Signed)
Addended by: Andree Coss on: 06/14/2013 03:57 PM   Modules accepted: Orders

## 2013-06-14 NOTE — Progress Notes (Signed)
Patient ID: Madison Newman, female   DOB: 04-18-1941, 72 y.o.   MRN: [id number]          [facility] for Infectious Disease  Patient Active Problem List   Diagnosis Date Noted  . Face lesion 05/19/2013  . Cataracts, bilateral 08/12/2012  . 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 ANXIETY 12/07/2007  . LUMPECTOMY, BREAST, HX OF 12/16/2006  . TAH/BSO, HX OF 12/16/2006  . HIV DISEASE 12/14/2006  . HYPERLIPIDEMIA 12/14/2006  . ANEMIA NEC 12/14/2006  . IRITIS 12/14/2006  . HYPERTENSION 12/14/2006  . REFLUX ESOPHAGITIS 12/14/2006  . BREAST CANCER, HX OF 12/14/2006    Patient's Medications  New Prescriptions   No medications on file  Previous Medications   ASCORBIC ACID (VITAMIN C) 100 MG TABLET    Take 100 mg by mouth daily.   CALCIUM CARBONATE 200 MG CAPSULE    Take 250 mg by mouth 2 (two) times daily with a meal.   DIFLUPREDNATE (DUREZOL) 0.05 % EMUL    Apply to eye.   EMTRICITAB-RILPIVIR-TENOFOVIR 200-25-300 MG TABS    Take 1 tablet by mouth daily.   NEBIVOLOL HCL (BYSTOLIC) 20 MG TABS    Take 1 tablet by mouth daily.     SPIRONOLACTONE (ALDACTONE) 12.5 MG TABS    Take 12.5 mg by mouth daily.     VITAMIN D, ERGOCALCIFEROL, (DRISDOL) 50000 UNITS CAPS    Take 1,000 Units by mouth daily.   Modified Medications   No medications on file  Discontinued Medications   CLINDAMYCIN (CLEOCIN) 300 MG CAPSULE    Take 300 mg by mouth 3 (three) times daily.    Subjective: Madison is in for her routine visit. She has completed her clindamycin therapy and feels like she is getting back to her normal baseline after therapy for a right parotid gland abscess. He is not having any more parotid pain or wound drainage. She has only minimal dry mouth. She continues to take her Complera and has not missed doses. However she is worried that the Complera is somehow making her sick. She played 18 holes of golf today and felt like she did not have her usual stamina. She feels like that might be related to Complera and  has not really considered the impact of her recent parotid abscess.  Review of Systems: Pertinent items are noted in HPI.  Past Medical History  Diagnosis Date  . Hypertension     History  Substance Use Topics  . Smoking status: Never Smoker   . Smokeless tobacco: Never Used  . Alcohol Use: No    No family history on file.  No Known Allergies  Objective: Temp: 98.5 F (36.9 C) (08/19 1510) Temp src: Oral (08/19 1510) BP: 181/73 mmHg (08/19 1510) Pulse Rate: 69 (08/19 1510)  General: She appears to feel better and is in good spirits Oral: No oropharyngeal lesions mucous membranes moist No parotid gland swelling. There is still a small open area without drainage over the right parotid gland. Skin: No rash Lungs: Clear Cor: Regular S1 and S2 no murmurs Mood and affect: Normal  Lab Results HIV 1 RNA Quant (copies/mL)  Date Value  06/06/2013 34*  05/18/2013 153*  03/24/2013 9198*     CD4 T Cell Abs (cmm)  Date Value  06/06/2013 160*  05/18/2013 200*  03/24/2013 190*     Lab Results  Component Value Date   WBC 3.2* 06/06/2013   HGB 10.3* 06/06/2013   HCT 31.6* 06/06/2013   MCV 85.4 06/06/2013  PLT 373 06/06/2013   BMET    Component Value Date/Time   NA 136 06/06/2013 0914   K 3.8 06/06/2013 0914   CL 103 06/06/2013 0914   CO2 28 06/06/2013 0914   GLUCOSE 71 06/06/2013 0914   BUN 19 06/06/2013 0914   CREATININE 0.98 06/06/2013 0914   CREATININE 0.83 01/07/2011 1748   CALCIUM 10.3 06/06/2013 0914   Lab Results  Component Value Date   ALT 18 06/06/2013   AST 27 06/06/2013   ALKPHOS 77 06/06/2013   BILITOT 0.4 06/06/2013   Assessment: Her parotid abscess has resolved and she is showing a good initial response to Complera. She is very concerned that her CD4 count has fallen further to 160 and asked me if that means Complera is failing. I explained to her that CD4 count we'll only start to rise slowly after she's had sufficient time on Complera with her virus suppressed.  Her CD4 count is likely to be low because of the residual effects of the parotid abscess as well. I reviewed the fact that her complete metabolic panel is completely normal and her leukocytosis has resolved. She is willing to continue Complera.  Plan: 1. Continue Complera 2. Pneumococcal vaccine booster 3. Will consider shingles vaccine once her CD4 count has reconstituted 4. Followup after lab work in 3 months   Cliffton Asters, MD [facility] for Infectious Disease [facility] [phone] pager   [phone] cell 06/14/2013, 3:44 PM

## 2013-06-20 DIAGNOSIS — D497 Neoplasm of unspecified behavior of endocrine glands and other parts of nervous system: Secondary | ICD-10-CM | POA: Diagnosis not present

## 2013-06-20 DIAGNOSIS — K113 Abscess of salivary gland: Secondary | ICD-10-CM | POA: Diagnosis not present

## 2013-06-21 ENCOUNTER — Ambulatory Visit: Payer: Medicare Other | Admitting: Internal Medicine

## 2013-09-01 ENCOUNTER — Other Ambulatory Visit: Payer: Self-pay

## 2013-09-06 ENCOUNTER — Other Ambulatory Visit (INDEPENDENT_AMBULATORY_CARE_PROVIDER_SITE_OTHER): Payer: Medicare Other

## 2013-09-06 DIAGNOSIS — B20 Human immunodeficiency virus [HIV] disease: Secondary | ICD-10-CM

## 2013-09-06 LAB — COMPLETE METABOLIC PANEL WITH GFR
AST: 30 U/L (ref 0–37)
Alkaline Phosphatase: 108 U/L (ref 39–117)
BUN: 17 mg/dL (ref 6–23)
Creat: 1.02 mg/dL (ref 0.50–1.10)
GFR, Est Non African American: 56 mL/min — ABNORMAL LOW
Potassium: 4.2 mEq/L (ref 3.5–5.3)
Total Bilirubin: 0.4 mg/dL (ref 0.3–1.2)

## 2013-09-06 LAB — CBC
HCT: 33.3 % — ABNORMAL LOW (ref 36.0–46.0)
MCH: 28.9 pg (ref 26.0–34.0)
MCHC: 33.3 g/dL (ref 30.0–36.0)
MCV: 86.7 fL (ref 78.0–100.0)
RDW: 14.7 % (ref 11.5–15.5)

## 2013-09-07 LAB — T-HELPER CELL (CD4) - (RCID CLINIC ONLY)
CD4 % Helper T Cell: 19 % — ABNORMAL LOW (ref 33–55)
CD4 T Cell Abs: 190 /uL — ABNORMAL LOW (ref 400–2700)

## 2013-09-07 LAB — HIV-1 RNA QUANT-NO REFLEX-BLD: HIV-1 RNA Quant, Log: <1.3 {Log} (ref <1.30)

## 2013-09-12 ENCOUNTER — Other Ambulatory Visit: Payer: Self-pay | Admitting: Licensed Clinical Social Worker

## 2013-09-12 ENCOUNTER — Encounter: Payer: Self-pay | Admitting: Internal Medicine

## 2013-09-12 DIAGNOSIS — B20 Human immunodeficiency virus [HIV] disease: Secondary | ICD-10-CM

## 2013-09-12 MED ORDER — EMTRICITAB-RILPIVIR-TENOFOV DF 200-25-300 MG PO TABS: 1.0000 | ORAL_TABLET | Freq: Every day | ORAL | Status: DC

## 2013-09-13 DIAGNOSIS — Z961 Presence of intraocular lens: Secondary | ICD-10-CM | POA: Diagnosis not present

## 2013-09-13 DIAGNOSIS — H20029 Recurrent acute iridocyclitis, unspecified eye: Secondary | ICD-10-CM | POA: Diagnosis not present

## 2013-09-13 DIAGNOSIS — Q100 Congenital ptosis: Secondary | ICD-10-CM | POA: Diagnosis not present

## 2013-09-13 DIAGNOSIS — H43399 Other vitreous opacities, unspecified eye: Secondary | ICD-10-CM | POA: Diagnosis not present

## 2013-09-20 ENCOUNTER — Encounter: Payer: Self-pay | Admitting: Internal Medicine

## 2013-09-20 ENCOUNTER — Ambulatory Visit (INDEPENDENT_AMBULATORY_CARE_PROVIDER_SITE_OTHER): Payer: Medicare Other | Admitting: Internal Medicine

## 2013-09-20 VITALS — BP 193/83 | HR 83 | Temp 98.5°F | Ht 65.5 in | Wt 149.5 lb

## 2013-09-20 DIAGNOSIS — B20 Human immunodeficiency virus [HIV] disease: Secondary | ICD-10-CM | POA: Diagnosis not present

## 2013-09-20 DIAGNOSIS — Z23 Encounter for immunization: Secondary | ICD-10-CM

## 2013-09-20 DIAGNOSIS — Z79899 Other long term (current) drug therapy: Secondary | ICD-10-CM | POA: Diagnosis not present

## 2013-09-20 NOTE — Progress Notes (Signed)
Patient ID: [name] W Yuhas, female   DOB: [birth year], 72 y.o.   MRN: [id number]          [facility] for Infectious Disease  Patient Active Problem List   Diagnosis Date Noted  . Face lesion 05/19/2013  . Cataracts, bilateral 08/12/2012  . 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 ANXIETY 12/07/2007  . LUMPECTOMY, BREAST, HX OF 12/16/2006  . TAH/BSO, HX OF 12/16/2006  . HIV DISEASE 12/14/2006  . HYPERLIPIDEMIA 12/14/2006  . ANEMIA NEC 12/14/2006  . IRITIS 12/14/2006  . HYPERTENSION 12/14/2006  . REFLUX ESOPHAGITIS 12/14/2006  . BREAST CANCER, HX OF 12/14/2006    Patient's Medications  New Prescriptions   No medications on file  Previous Medications   ASCORBIC ACID (VITAMIN C) 100 MG TABLET    Take 100 mg by mouth daily.   CALCIUM CARBONATE 200 MG CAPSULE    Take 250 mg by mouth 2 (two) times daily with a meal.   DIFLUPREDNATE (DUREZOL) 0.05 % EMUL    Apply to eye.   EMTRICITAB-RILPIVIR-TENOFOVIR 200-25-300 MG TABS    Take 1 tablet by mouth daily.   NEBIVOLOL HCL (BYSTOLIC) 20 MG TABS    Take 1 tablet by mouth daily.     SPIRONOLACTONE (ALDACTONE) 12.5 MG TABS    Take 12.5 mg by mouth daily.     VITAMIN D, ERGOCALCIFEROL, (DRISDOL) 50000 UNITS CAPS    Take 1,000 Units by mouth daily.   Modified Medications   No medications on file  Discontinued Medications   No medications on file    Subjective: Greggory Stallion is in for her routine visit. She has been taking and tolerating a Complera well. She says that she feels like she did before she started treatment. She has some muscle stiffness in her lower extremities and an occasional dry skin and has had some concern that it might be related to Complera. She has not missed any doses.  Review of Systems: Pertinent items are noted in HPI.  Past Medical History  Diagnosis Date  . Hypertension     History  Substance Use Topics  . Smoking status: Never Smoker   . Smokeless tobacco: Never Used  . Alcohol Use: No    No family history on  file.  No Known Allergies  Objective: Temp: 98.5 F (36.9 C) (11/25 0844) Temp src: Oral (11/25 0844) BP: 193/83 mmHg (11/25 0844) Pulse Rate: 83 (11/25 0844)  General: She is in good spirits Oral: No oropharyngeal lesions Skin: No rash or unusual dryness Lungs: Clear Cor: Regular S1-S2 no murmurs  Lab Results HIV 1 RNA Quant (copies/mL)  Date Value  09/06/2013 <20   06/06/2013 34*  05/18/2013 153*     CD4 T Cell Abs (/uL)  Date Value  09/06/2013 190*  06/06/2013 160*  05/18/2013 200*     Lab Results  Component Value Date   WBC 3.1* 09/06/2013   HGB 11.1* 09/06/2013   HCT 33.3* 09/06/2013   MCV 86.7 09/06/2013   PLT 292 09/06/2013   BMET    Component Value Date/Time   NA 135 09/06/2013 1116   K 4.2 09/06/2013 1116   CL 100 09/06/2013 1116   CO2 25 09/06/2013 1116   GLUCOSE 76 09/06/2013 1116   BUN 17 09/06/2013 1116   CREATININE 1.02 09/06/2013 1116   CREATININE 0.83 01/07/2011 1748   CALCIUM 10.0 09/06/2013 1116    Assessment: She has had an excellent response to Complera viral load is undetectable. CD4 reconstitution is likely to occur slowly over  the next several years.  Plan: 1. Continue Complera 2. Followup after lab work in 3 months   Cliffton Asters, MD [facility] for Infectious Disease [facility] [phone] pager   [phone] cell 09/20/2013, 9:03 AM

## 2013-12-19 ENCOUNTER — Other Ambulatory Visit: Payer: Medicare Other

## 2013-12-19 ENCOUNTER — Ambulatory Visit: Payer: Self-pay | Admitting: *Deleted

## 2013-12-19 DIAGNOSIS — Z79899 Other long term (current) drug therapy: Secondary | ICD-10-CM | POA: Diagnosis not present

## 2013-12-19 DIAGNOSIS — B20 Human immunodeficiency virus [HIV] disease: Secondary | ICD-10-CM | POA: Diagnosis not present

## 2013-12-19 DIAGNOSIS — Z113 Encounter for screening for infections with a predominantly sexual mode of transmission: Secondary | ICD-10-CM

## 2013-12-19 LAB — COMPLETE METABOLIC PANEL WITH GFR
ALT: 13 U/L (ref 0–35)
AST: 25 U/L (ref 0–37)
Albumin: 4.2 g/dL (ref 3.5–5.2)
Alkaline Phosphatase: 117 U/L (ref 39–117)
BILIRUBIN TOTAL: 0.4 mg/dL (ref 0.2–1.2)
BUN: 16 mg/dL (ref 6–23)
CO2: 29 meq/L (ref 19–32)
CREATININE: 1.04 mg/dL (ref 0.50–1.10)
Calcium: 10.2 mg/dL (ref 8.4–10.5)
Chloride: 101 mEq/L (ref 96–112)
GFR, EST AFRICAN AMERICAN: 63 mL/min
GFR, EST NON AFRICAN AMERICAN: 55 mL/min — AB
GLUCOSE: 82 mg/dL (ref 70–99)
Potassium: 4.2 mEq/L (ref 3.5–5.3)
SODIUM: 135 meq/L (ref 135–145)
TOTAL PROTEIN: 8.3 g/dL (ref 6.0–8.3)

## 2013-12-19 LAB — CBC
HEMATOCRIT: 31.8 % — AB (ref 36.0–46.0)
Hemoglobin: 10.9 g/dL — ABNORMAL LOW (ref 12.0–15.0)
MCH: 29.7 pg (ref 26.0–34.0)
MCHC: 34.3 g/dL (ref 30.0–36.0)
MCV: 86.6 fL (ref 78.0–100.0)
Platelets: 258 10*3/uL (ref 150–400)
RBC: 3.67 MIL/uL — ABNORMAL LOW (ref 3.87–5.11)
RDW: 14.2 % (ref 11.5–15.5)
WBC: 3.6 10*3/uL — ABNORMAL LOW (ref 4.0–10.5)

## 2013-12-19 LAB — LIPID PANEL
Cholesterol: 176 mg/dL (ref 0–200)
HDL: 46 mg/dL (ref >39)
LDL CALC: 114 mg/dL — AB (ref 0–99)
TRIGLYCERIDES: 81 mg/dL (ref <150)
Total CHOL/HDL Ratio: 3.8 Ratio
VLDL: 16 mg/dL (ref 0–40)

## 2013-12-19 LAB — RPR

## 2013-12-20 ENCOUNTER — Other Ambulatory Visit: Payer: Medicare Other

## 2013-12-20 ENCOUNTER — Ambulatory Visit: Payer: Medicare Other

## 2013-12-20 LAB — T-HELPER CELL (CD4) - (RCID CLINIC ONLY)
CD4 % Helper T Cell: 21 % — ABNORMAL LOW (ref 33–55)
CD4 T Cell Abs: 270 /uL — ABNORMAL LOW (ref 400–2700)

## 2013-12-20 LAB — HIV-1 RNA QUANT-NO REFLEX-BLD
HIV 1 RNA Quant: 43 copies/mL — ABNORMAL HIGH (ref <20)
HIV-1 RNA Quant, Log: 1.63 {Log} — ABNORMAL HIGH (ref <1.30)

## 2013-12-21 ENCOUNTER — Other Ambulatory Visit: Payer: Self-pay | Admitting: *Deleted

## 2013-12-21 DIAGNOSIS — B20 Human immunodeficiency virus [HIV] disease: Secondary | ICD-10-CM

## 2013-12-21 MED ORDER — EMTRICITAB-RILPIVIR-TENOFOV DF 200-25-300 MG PO TABS: 1.0000 | ORAL_TABLET | Freq: Every day | ORAL | Status: DC

## 2014-01-03 ENCOUNTER — Ambulatory Visit: Payer: Medicare Other | Admitting: Internal Medicine

## 2014-01-04 ENCOUNTER — Ambulatory Visit: Payer: Medicare Other | Admitting: Internal Medicine

## 2014-01-10 ENCOUNTER — Ambulatory Visit: Payer: Medicare Other | Admitting: Internal Medicine

## 2014-01-11 ENCOUNTER — Ambulatory Visit: Payer: Medicare Other | Admitting: Internal Medicine

## 2014-01-17 ENCOUNTER — Encounter: Payer: Self-pay | Admitting: Internal Medicine

## 2014-01-17 ENCOUNTER — Ambulatory Visit (INDEPENDENT_AMBULATORY_CARE_PROVIDER_SITE_OTHER): Payer: Medicare Other | Admitting: Internal Medicine

## 2014-01-17 VITALS — BP 209/74 | HR 64 | Temp 98.6°F | Ht 65.5 in | Wt 150.8 lb

## 2014-01-17 DIAGNOSIS — B20 Human immunodeficiency virus [HIV] disease: Secondary | ICD-10-CM | POA: Diagnosis not present

## 2014-01-17 NOTE — Progress Notes (Signed)
Patient ID: Madison Newman, female   DOB: [birth year], 73 y.o.   MRN: [id number] @LOGODEPT         Patient Active Problem List   Diagnosis Date Noted  . Face lesion 05/19/2013  . Cataracts, bilateral 08/12/2012  . 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 ANXIETY 12/07/2007  . LUMPECTOMY, BREAST, HX OF 12/16/2006  . TAH/BSO, HX OF 12/16/2006  . HIV DISEASE 12/14/2006  . HYPERLIPIDEMIA 12/14/2006  . ANEMIA NEC 12/14/2006  . IRITIS 12/14/2006  . HYPERTENSION 12/14/2006  . REFLUX ESOPHAGITIS 12/14/2006  . BREAST CANCER, HX OF 12/14/2006    Patient's Medications  New Prescriptions   No medications on file  Previous Medications   ASCORBIC ACID (VITAMIN C) 100 MG TABLET    Take 100 mg by mouth daily.   CALCIUM CARBONATE 200 MG CAPSULE    Take 250 mg by mouth 2 (two) times daily with a meal.   DIFLUPREDNATE (DUREZOL) 0.05 % EMUL    Apply to eye.   EMTRICITAB-RILPIVIR-TENOFOVIR 200-25-300 MG TABS    Take 1 tablet by mouth daily.   NEBIVOLOL HCL (BYSTOLIC) 20 MG TABS    Take 1 tablet by mouth daily.     SPIRONOLACTONE (ALDACTONE) 12.5 MG TABS    Take 12.5 mg by mouth daily.     VITAMIN D, ERGOCALCIFEROL, (DRISDOL) 50000 UNITS CAPS    Take 1,000 Units by mouth daily.   Modified Medications   No medications on file  Discontinued Medications   No medications on file    Subjective: Madison is in for her routine visit. She has not missed any doses of her Complera. She states that sometimes she is worried that she will notice the smell of complaint or after she has taken it and wonders if others around her can smell it. No one has mentioned anything to her. Overall she finds it very easy to tolerate compared with past regimens. However, she states that she does not want any adverse reactions to sneak up on her.  Review of Systems: Pertinent items are noted in HPI.  Past Medical History  Diagnosis Date  . Hypertension     History  Substance Use Topics  . Smoking status: Never Smoker   . Smokeless  tobacco: Never Used  . Alcohol Use: No    No family history on file.  No Known Allergies  Objective: Temp: 98.6 F (37 C) (03/24 1621) Temp src: Oral (03/24 1621) BP: 209/74 mmHg (03/24 1621) Pulse Rate: 64 (03/24 1621) Body mass index is 24.7 kg/(m^2).  General: She is well dressed and in good spirits Oral: No oropharyngeal lesions Skin: No rash Lungs: Clear Cor: Regular S1 and S2 with no murmurs Abdomen: Soft nontender Mood and affect: Bright and appropriate  Lab Results Lab Results  Component Value Date   WBC 3.6* 12/19/2013   HGB 10.9* 12/19/2013   HCT 31.8* 12/19/2013   MCV 86.6 12/19/2013   PLT 258 12/19/2013    Lab Results  Component Value Date   CREATININE 1.04 12/19/2013   BUN 16 12/19/2013   NA 135 12/19/2013   K 4.2 12/19/2013   CL 101 12/19/2013   CO2 29 12/19/2013    Lab Results  Component Value Date   ALT 13 12/19/2013   AST 25 12/19/2013   ALKPHOS 117 12/19/2013   BILITOT 0.4 12/19/2013    Lab Results  Component Value Date   CHOL 176 12/19/2013   HDL 46 12/19/2013   LDLCALC 114* 12/19/2013   TRIG 81 12/19/2013  CHOLHDL 3.8 12/19/2013    Lab Results HIV 1 RNA Quant (copies/mL)  Date Value  12/19/2013 43*  09/06/2013 <20   06/06/2013 34*     CD4 T Cell Abs (/uL)  Date Value  12/19/2013 270*  09/06/2013 190*  06/06/2013 160*     Assessment: For HIV infection is under much better control since starting complaint her last year. She is willing to continue it. I do not see any evidence of adverse side effects.  Plan: 1. Continue Complera 2. Followup after lab work in 3 months   Michel Bickers, MD [facility] for [place] [phone] pager   [phone] cell 01/17/2014, 4:48 PM

## 2014-01-19 ENCOUNTER — Ambulatory Visit: Payer: Medicare Other

## 2014-01-19 DIAGNOSIS — I10 Essential (primary) hypertension: Secondary | ICD-10-CM | POA: Diagnosis not present

## 2014-01-26 ENCOUNTER — Ambulatory Visit (INDEPENDENT_AMBULATORY_CARE_PROVIDER_SITE_OTHER): Payer: Medicare Other | Admitting: *Deleted

## 2014-01-26 DIAGNOSIS — Z2911 Encounter for prophylactic immunotherapy for respiratory syncytial virus (RSV): Secondary | ICD-10-CM

## 2014-01-26 DIAGNOSIS — B20 Human immunodeficiency virus [HIV] disease: Secondary | ICD-10-CM | POA: Diagnosis not present

## 2014-03-20 ENCOUNTER — Encounter: Payer: Self-pay | Admitting: Internal Medicine

## 2014-03-21 ENCOUNTER — Other Ambulatory Visit: Payer: Self-pay | Admitting: *Deleted

## 2014-03-21 DIAGNOSIS — B20 Human immunodeficiency virus [HIV] disease: Secondary | ICD-10-CM

## 2014-03-21 MED ORDER — EMTRICITAB-RILPIVIR-TENOFOV DF 200-25-300 MG PO TABS: 1.0000 | ORAL_TABLET | Freq: Every day | ORAL | Status: DC

## 2014-03-30 ENCOUNTER — Encounter: Payer: Self-pay | Admitting: Licensed Clinical Social Worker

## 2014-04-03 ENCOUNTER — Other Ambulatory Visit: Payer: Medicare Other

## 2014-04-03 DIAGNOSIS — B20 Human immunodeficiency virus [HIV] disease: Secondary | ICD-10-CM

## 2014-04-03 LAB — COMPLETE METABOLIC PANEL WITH GFR
ALBUMIN: 4.1 g/dL (ref 3.5–5.2)
ALK PHOS: 131 U/L — AB (ref 39–117)
ALT: 14 U/L (ref 0–35)
AST: 25 U/L (ref 0–37)
BUN: 17 mg/dL (ref 6–23)
CO2: 28 mEq/L (ref 19–32)
Calcium: 10.5 mg/dL (ref 8.4–10.5)
Chloride: 101 mEq/L (ref 96–112)
Creat: 1.13 mg/dL — ABNORMAL HIGH (ref 0.50–1.10)
GFR, Est African American: 57 mL/min — ABNORMAL LOW
GFR, Est Non African American: 49 mL/min — ABNORMAL LOW
Glucose, Bld: 60 mg/dL — ABNORMAL LOW (ref 70–99)
POTASSIUM: 4.8 meq/L (ref 3.5–5.3)
SODIUM: 136 meq/L (ref 135–145)
TOTAL PROTEIN: 7.9 g/dL (ref 6.0–8.3)
Total Bilirubin: 0.4 mg/dL (ref 0.2–1.2)

## 2014-04-03 LAB — CBC
HCT: 33.3 % — ABNORMAL LOW (ref 36.0–46.0)
Hemoglobin: 11.3 g/dL — ABNORMAL LOW (ref 12.0–15.0)
MCH: 29.7 pg (ref 26.0–34.0)
MCHC: 33.9 g/dL (ref 30.0–36.0)
MCV: 87.4 fL (ref 78.0–100.0)
PLATELETS: 282 10*3/uL (ref 150–400)
RBC: 3.81 MIL/uL — AB (ref 3.87–5.11)
RDW: 14.2 % (ref 11.5–15.5)
WBC: 3.6 10*3/uL — AB (ref 4.0–10.5)

## 2014-04-04 LAB — T-HELPER CELL (CD4) - (RCID CLINIC ONLY)
CD4 % Helper T Cell: 22 % — ABNORMAL LOW (ref 33–55)
CD4 T Cell Abs: 240 /uL — ABNORMAL LOW (ref 400–2700)

## 2014-04-04 LAB — HIV-1 RNA QUANT-NO REFLEX-BLD: HIV-1 RNA Quant, Log: <1.3 {Log} (ref <1.30)

## 2014-04-06 ENCOUNTER — Telehealth: Payer: Self-pay | Admitting: *Deleted

## 2014-04-06 ENCOUNTER — Encounter: Payer: Self-pay | Admitting: Internal Medicine

## 2014-04-06 NOTE — Telephone Encounter (Signed)
Please see the following concerns that the patient had regarding her lab results.  She has follow up scheduled 6/24. Thank you,  Sharyn Lull  Test shows that Alkaline phosphatase is high (sign of how well the liver is functioning). Was concerned about the liver and medication from the beginning. We need a solution to this problem before it get out of hand. GWB See Creatine is high. My research show that means a sign of how well your kidney is functioning. Have been concerned about the kidney and medication from the beginning. Also the kidney measure is lower than the last time. I am concerned!! GWB

## 2014-04-11 NOTE — Telephone Encounter (Signed)
Please let [place] know that continuing Complera is perfectly safe. I will discuss her lab results at her up coming visit.

## 2014-04-11 NOTE — Telephone Encounter (Signed)
I left a message on her phone with your advice.  Thank you!

## 2014-04-19 ENCOUNTER — Ambulatory Visit (INDEPENDENT_AMBULATORY_CARE_PROVIDER_SITE_OTHER): Payer: Medicare Other | Admitting: Internal Medicine

## 2014-04-19 ENCOUNTER — Encounter: Payer: Self-pay | Admitting: Internal Medicine

## 2014-04-19 VITALS — BP 182/94 | HR 58 | Temp 98.2°F | Wt 148.2 lb

## 2014-04-19 DIAGNOSIS — E785 Hyperlipidemia, unspecified: Secondary | ICD-10-CM | POA: Diagnosis not present

## 2014-04-19 DIAGNOSIS — B20 Human immunodeficiency virus [HIV] disease: Secondary | ICD-10-CM

## 2014-04-19 NOTE — Progress Notes (Signed)
Patient ID: Madison Newman, female   DOB: 07/17/1941, 73 y.o.   MRN: [id number]          Patient Active Problem List   Diagnosis Date Noted  . HIV DISEASE 12/14/2006    Priority: High  . Face lesion 05/19/2013  . Cataracts, bilateral 08/12/2012  . 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 ANXIETY 12/07/2007  . LUMPECTOMY, BREAST, HX OF 12/16/2006  . TAH/BSO, HX OF 12/16/2006  . HYPERLIPIDEMIA 12/14/2006  . ANEMIA NEC 12/14/2006  . IRITIS 12/14/2006  . HYPERTENSION 12/14/2006  . REFLUX ESOPHAGITIS 12/14/2006  . BREAST CANCER, HX OF 12/14/2006    Patient's Medications  New Prescriptions   No medications on file  Previous Medications   ASCORBIC ACID (VITAMIN C) 100 MG TABLET    Take 100 mg by mouth daily.   CALCIUM CARBONATE 200 MG CAPSULE    Take 250 mg by mouth 2 (two) times daily with a meal.   DIFLUPREDNATE (DUREZOL) 0.05 % EMUL    Apply to eye.   EMTRICITAB-RILPIVIR-TENOFOVIR 200-25-300 MG TABS    Take 1 tablet by mouth daily.   NEBIVOLOL HCL (BYSTOLIC) 20 MG TABS    Take 1 tablet by mouth daily.     SPIRONOLACTONE (ALDACTONE) 12.5 MG TABS    Take 12.5 mg by mouth daily.     VITAMIN D, ERGOCALCIFEROL, (DRISDOL) 50000 UNITS CAPS    Take 2,000 Units by mouth daily.   Modified Medications   No medications on file  Discontinued Medications   No medications on file    Subjective: Madison Newman is in for her routine visit. Is not having any problem tolerating her Complera and has not missed any doses. Review of Systems: Pertinent items are noted in HPI.  Past Medical History  Diagnosis Date  . Hypertension     History  Substance Use Topics  . Smoking status: Never Smoker   . Smokeless tobacco: Never Used  . Alcohol Use: No    No family history on file.  No Known Allergies  Objective: Temp: 98.2 F (36.8 C) (06/24 1608) Temp src: Oral (06/24 1608) BP: 182/94 mmHg (06/24 1608) Pulse Rate: 58 (06/24 1608) Body mass index is 24.29 kg/(m^2).  General: She appears healthy and in  good spirits Oral: No oropharyngeal lesions Skin: No rash Lungs: Clear Cor: Regular S1 and S2 with no murmurs Joints and extremities: Slight swelling of her right lateral ankle after she twisted it playing golf yesterday. Minimal pain.  Lab Results Lab Results  Component Value Date   WBC 3.6* 04/03/2014   HGB 11.3* 04/03/2014   HCT 33.3* 04/03/2014   MCV 87.4 04/03/2014   PLT 282 04/03/2014    Lab Results  Component Value Date   CREATININE 1.13* 04/03/2014   BUN 17 04/03/2014   NA 136 04/03/2014   K 4.8 04/03/2014   CL 101 04/03/2014   CO2 28 04/03/2014    Lab Results  Component Value Date   ALT 14 04/03/2014   AST 25 04/03/2014   ALKPHOS 131* 04/03/2014   BILITOT 0.4 04/03/2014    Lab Results  Component Value Date   CHOL 176 12/19/2013   HDL 46 12/19/2013   LDLCALC 114* 12/19/2013   TRIG 81 12/19/2013   CHOLHDL 3.8 12/19/2013    Lab Results HIV 1 RNA Quant (copies/mL)  Date Value  04/03/2014 <20   12/19/2013 43*  09/06/2013 <20      CD4 T Cell Abs (/uL)  Date Value  04/03/2014 240*  12/19/2013 270*  09/06/2013 190*     Assessment: Her HIV infection has come under excellent control and she is having CD4 reconstitution since starting therapy last year. She is tolerating Complera well. I reassured her that the minor abnormalities in her chemistry panel are not clinically significant but that I will continue to monitor her blood work before each visit.  Plan: 1. Continue Complera 2. Followup after lab work in 3 months   Michel Bickers, MD [facility] for [place] [phone] pager   [phone] cell 04/19/2014, 4:43 PM

## 2014-05-29 ENCOUNTER — Encounter: Payer: Self-pay | Admitting: Internal Medicine

## 2014-06-05 ENCOUNTER — Encounter: Payer: Self-pay | Admitting: Internal Medicine

## 2014-06-19 ENCOUNTER — Ambulatory Visit: Payer: Medicare Other

## 2014-06-19 ENCOUNTER — Other Ambulatory Visit: Payer: Self-pay | Admitting: *Deleted

## 2014-06-19 DIAGNOSIS — B20 Human immunodeficiency virus [HIV] disease: Secondary | ICD-10-CM

## 2014-06-19 MED ORDER — EMTRICITAB-RILPIVIR-TENOFOV DF 200-25-300 MG PO TABS: 1.0000 | ORAL_TABLET | Freq: Every day | ORAL | Status: DC

## 2014-06-19 NOTE — Telephone Encounter (Signed)
ADAP Application 

## 2014-06-23 ENCOUNTER — Encounter: Payer: Self-pay | Admitting: Internal Medicine

## 2014-07-06 ENCOUNTER — Other Ambulatory Visit: Payer: Medicare Other

## 2014-07-06 DIAGNOSIS — E785 Hyperlipidemia, unspecified: Secondary | ICD-10-CM | POA: Diagnosis not present

## 2014-07-06 DIAGNOSIS — B20 Human immunodeficiency virus [HIV] disease: Secondary | ICD-10-CM | POA: Diagnosis not present

## 2014-07-06 LAB — CBC
HEMATOCRIT: 31.5 % — AB (ref 36.0–46.0)
Hemoglobin: 10.7 g/dL — ABNORMAL LOW (ref 12.0–15.0)
MCH: 29.5 pg (ref 26.0–34.0)
MCHC: 34 g/dL (ref 30.0–36.0)
MCV: 86.8 fL (ref 78.0–100.0)
Platelets: 282 10*3/uL (ref 150–400)
RBC: 3.63 MIL/uL — AB (ref 3.87–5.11)
RDW: 14.5 % (ref 11.5–15.5)
WBC: 4 10*3/uL (ref 4.0–10.5)

## 2014-07-06 LAB — COMPLETE METABOLIC PANEL WITH GFR
ALBUMIN: 4.1 g/dL (ref 3.5–5.2)
ALT: 13 U/L (ref 0–35)
AST: 24 U/L (ref 0–37)
Alkaline Phosphatase: 109 U/L (ref 39–117)
BUN: 21 mg/dL (ref 6–23)
CALCIUM: 10.1 mg/dL (ref 8.4–10.5)
CHLORIDE: 104 meq/L (ref 96–112)
CO2: 26 meq/L (ref 19–32)
Creat: 1.17 mg/dL — ABNORMAL HIGH (ref 0.50–1.10)
GFR, EST AFRICAN AMERICAN: 55 mL/min — AB
GFR, Est Non African American: 47 mL/min — ABNORMAL LOW
Glucose, Bld: 73 mg/dL (ref 70–99)
POTASSIUM: 4.6 meq/L (ref 3.5–5.3)
Sodium: 135 mEq/L (ref 135–145)
Total Bilirubin: 0.5 mg/dL (ref 0.2–1.2)
Total Protein: 8 g/dL (ref 6.0–8.3)

## 2014-07-06 LAB — LIPID PANEL
Cholesterol: 172 mg/dL (ref 0–200)
HDL: 44 mg/dL (ref >39)
LDL CALC: 113 mg/dL — AB (ref 0–99)
Total CHOL/HDL Ratio: 3.9 Ratio
Triglycerides: 75 mg/dL (ref <150)
VLDL: 15 mg/dL (ref 0–40)

## 2014-07-07 LAB — T-HELPER CELL (CD4) - (RCID CLINIC ONLY)
CD4 T CELL HELPER: 24 % — AB (ref 33–55)
CD4 T Cell Abs: 270 /uL — ABNORMAL LOW (ref 400–2700)

## 2014-07-07 LAB — HIV-1 RNA QUANT-NO REFLEX-BLD: HIV 1 RNA Quant: <20 copies/mL (ref <20)

## 2014-07-19 DIAGNOSIS — I10 Essential (primary) hypertension: Secondary | ICD-10-CM | POA: Diagnosis not present

## 2014-07-19 DIAGNOSIS — E119 Type 2 diabetes mellitus without complications: Secondary | ICD-10-CM | POA: Diagnosis not present

## 2014-07-20 ENCOUNTER — Ambulatory Visit (INDEPENDENT_AMBULATORY_CARE_PROVIDER_SITE_OTHER): Payer: Medicare Other | Admitting: Internal Medicine

## 2014-07-20 ENCOUNTER — Encounter: Payer: Self-pay | Admitting: Internal Medicine

## 2014-07-20 VITALS — BP 184/69 | HR 61 | Temp 98.3°F | Wt 146.8 lb

## 2014-07-20 DIAGNOSIS — Z0000 Encounter for general adult medical examination without abnormal findings: Secondary | ICD-10-CM

## 2014-07-20 DIAGNOSIS — B20 Human immunodeficiency virus [HIV] disease: Secondary | ICD-10-CM

## 2014-07-20 DIAGNOSIS — Z23 Encounter for immunization: Secondary | ICD-10-CM | POA: Diagnosis not present

## 2014-07-20 NOTE — Progress Notes (Signed)
Patient ID: Madison Newman, female   DOB: April 03, 1941, 73 y.o.   MRN: [id number]          Patient Active Problem List   Diagnosis Date Noted  . HIV DISEASE 12/14/2006    Priority: High  . Face lesion 05/19/2013  . Cataracts, bilateral 08/12/2012  . 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 ANXIETY 12/07/2007  . LUMPECTOMY, BREAST, HX OF 12/16/2006  . TAH/BSO, HX OF 12/16/2006  . HYPERLIPIDEMIA 12/14/2006  . ANEMIA NEC 12/14/2006  . IRITIS 12/14/2006  . HYPERTENSION 12/14/2006  . REFLUX ESOPHAGITIS 12/14/2006  . BREAST CANCER, HX OF 12/14/2006    Patient's Medications  New Prescriptions   No medications on file  Previous Medications   ASCORBIC ACID (VITAMIN C) 100 MG TABLET    Take 100 mg by mouth daily.   CALCIUM CARBONATE 200 MG CAPSULE    Take 250 mg by mouth 2 (two) times daily with a meal.   DIFLUPREDNATE (DUREZOL) 0.05 % EMUL    Apply to eye.   EMTRICITAB-RILPIVIR-TENOFOVIR 200-25-300 MG TABS    Take 1 tablet by mouth daily.   NEBIVOLOL HCL (BYSTOLIC) 20 MG TABS    Take 1 tablet by mouth daily.     SPIRONOLACTONE (ALDACTONE) 12.5 MG TABS    Take 12.5 mg by mouth daily.     VITAMIN D, ERGOCALCIFEROL, (DRISDOL) 50000 UNITS CAPS    Take 2,000 Units by mouth daily.   Modified Medications   No medications on file  Discontinued Medications   No medications on file    Subjective: Madison is in for her routine visit. She is not having any problem tolerating her Complera and has not missed any doses.   Past Medical History  Diagnosis Date  . Hypertension     History  Substance Use Topics  . Smoking status: Never Smoker   . Smokeless tobacco: Never Used  . Alcohol Use: No    No family history on file.  No Known Allergies  Objective: Temp: 98.3 F (36.8 C) (09/24 1023) Temp src: Oral (09/24 1023) BP: 184/69 mmHg (09/24 1023) Pulse Rate: 61 (09/24 1023) Body mass index is 24.04 kg/(m^2).  General: She is in good spirits Oral: No oropharyngeal lesions Skin: No  rash Lungs: Clear Cor: Regular S1 and S2 with no murmurs  Lab Results Lab Results  Component Value Date   WBC 4.0 07/06/2014   HGB 10.7* 07/06/2014   HCT 31.5* 07/06/2014   MCV 86.8 07/06/2014   PLT 282 07/06/2014    Lab Results  Component Value Date   CREATININE 1.17* 07/06/2014   BUN 21 07/06/2014   NA 135 07/06/2014   K 4.6 07/06/2014   CL 104 07/06/2014   CO2 26 07/06/2014    Lab Results  Component Value Date   ALT 13 07/06/2014   AST 24 07/06/2014   ALKPHOS 109 07/06/2014   BILITOT 0.5 07/06/2014    Lab Results  Component Value Date   CHOL 172 07/06/2014   HDL 44 07/06/2014   LDLCALC 113* 07/06/2014   TRIG 75 07/06/2014   CHOLHDL 3.9 07/06/2014    Lab Results HIV 1 RNA Quant (copies/mL)  Date Value  07/06/2014 <20   04/03/2014 <20   12/19/2013 43*     CD4 T Cell Abs (/uL)  Date Value  07/06/2014 270*  04/03/2014 240*  12/19/2013 270*     Assessment: Her HIV infection has come under excellent control and she is having CD4 reconstitution since starting therapy last year. She  is tolerating Complera well.   Plan: 1. Continue Complera 2. Followup after lab work in 3 months   Michel Bickers, MD [facility] for [place] [phone] pager   [phone] cell 07/20/2014, 10:51 AM

## 2014-08-24 ENCOUNTER — Encounter: Payer: Self-pay | Admitting: *Deleted

## 2014-08-24 NOTE — Telephone Encounter (Addendum)
Error

## 2014-10-13 DIAGNOSIS — Z961 Presence of intraocular lens: Secondary | ICD-10-CM | POA: Diagnosis not present

## 2014-10-13 DIAGNOSIS — H26491 Other secondary cataract, right eye: Secondary | ICD-10-CM | POA: Diagnosis not present

## 2014-10-13 DIAGNOSIS — H02403 Unspecified ptosis of bilateral eyelids: Secondary | ICD-10-CM | POA: Diagnosis not present

## 2014-10-13 DIAGNOSIS — H43813 Vitreous degeneration, bilateral: Secondary | ICD-10-CM | POA: Diagnosis not present

## 2014-10-16 ENCOUNTER — Other Ambulatory Visit (INDEPENDENT_AMBULATORY_CARE_PROVIDER_SITE_OTHER): Payer: Medicare Other

## 2014-10-16 DIAGNOSIS — H26491 Other secondary cataract, right eye: Secondary | ICD-10-CM | POA: Diagnosis not present

## 2014-10-16 DIAGNOSIS — B20 Human immunodeficiency virus [HIV] disease: Secondary | ICD-10-CM

## 2014-10-16 DIAGNOSIS — Z961 Presence of intraocular lens: Secondary | ICD-10-CM | POA: Diagnosis not present

## 2014-10-16 DIAGNOSIS — Z113 Encounter for screening for infections with a predominantly sexual mode of transmission: Secondary | ICD-10-CM

## 2014-10-16 LAB — COMPREHENSIVE METABOLIC PANEL
ALK PHOS: 121 U/L — AB (ref 39–117)
ALT: 11 U/L (ref 0–35)
AST: 23 U/L (ref 0–37)
Albumin: 4.3 g/dL (ref 3.5–5.2)
BILIRUBIN TOTAL: 0.4 mg/dL (ref 0.2–1.2)
BUN: 19 mg/dL (ref 6–23)
CO2: 28 mEq/L (ref 19–32)
CREATININE: 1.16 mg/dL — AB (ref 0.50–1.10)
Calcium: 10.3 mg/dL (ref 8.4–10.5)
Chloride: 101 mEq/L (ref 96–112)
Glucose, Bld: 95 mg/dL (ref 70–99)
Potassium: 4.4 mEq/L (ref 3.5–5.3)
Sodium: 136 mEq/L (ref 135–145)
Total Protein: 7.9 g/dL (ref 6.0–8.3)

## 2014-10-16 LAB — CBC
HCT: 33 % — ABNORMAL LOW (ref 36.0–46.0)
Hemoglobin: 11 g/dL — ABNORMAL LOW (ref 12.0–15.0)
MCH: 30.2 pg (ref 26.0–34.0)
MCHC: 33.3 g/dL (ref 30.0–36.0)
MCV: 90.7 fL (ref 78.0–100.0)
MPV: 9.1 fL — ABNORMAL LOW (ref 9.4–12.4)
PLATELETS: 318 10*3/uL (ref 150–400)
RBC: 3.64 MIL/uL — ABNORMAL LOW (ref 3.87–5.11)
RDW: 13.6 % (ref 11.5–15.5)
WBC: 4.8 10*3/uL (ref 4.0–10.5)

## 2014-10-16 LAB — RPR

## 2014-10-17 LAB — HIV-1 RNA QUANT-NO REFLEX-BLD

## 2014-10-18 ENCOUNTER — Ambulatory Visit: Payer: Medicare Other | Admitting: Internal Medicine

## 2014-10-26 ENCOUNTER — Encounter: Payer: Self-pay | Admitting: Internal Medicine

## 2014-10-31 ENCOUNTER — Ambulatory Visit: Payer: Medicare Other

## 2014-10-31 ENCOUNTER — Ambulatory Visit (INDEPENDENT_AMBULATORY_CARE_PROVIDER_SITE_OTHER): Payer: Medicare Other | Admitting: Internal Medicine

## 2014-10-31 ENCOUNTER — Encounter: Payer: Self-pay | Admitting: Internal Medicine

## 2014-10-31 VITALS — BP 213/82 | HR 64 | Temp 98.6°F | Wt 152.5 lb

## 2014-10-31 DIAGNOSIS — Z79899 Other long term (current) drug therapy: Secondary | ICD-10-CM | POA: Diagnosis not present

## 2014-10-31 DIAGNOSIS — B20 Human immunodeficiency virus [HIV] disease: Secondary | ICD-10-CM

## 2014-10-31 NOTE — Progress Notes (Signed)
Patient ID: Madison Newman, female   DOB: [birth year], 74 y.o.   MRN: [id number]          Patient Active Problem List   Diagnosis Date Noted  . Human immunodeficiency virus (HIV) disease 12/14/2006    Priority: High  . Face lesion 05/19/2013  . Cataracts, bilateral 08/12/2012  . 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 ANXIETY 12/07/2007  . LUMPECTOMY, BREAST, HX OF 12/16/2006  . TAH/BSO, HX OF 12/16/2006  . HYPERLIPIDEMIA 12/14/2006  . ANEMIA NEC 12/14/2006  . IRITIS 12/14/2006  . HYPERTENSION 12/14/2006  . REFLUX ESOPHAGITIS 12/14/2006  . BREAST CANCER, HX OF 12/14/2006    Patient's Medications  New Prescriptions   No medications on file  Previous Medications   ASCORBIC ACID (VITAMIN C) 100 MG TABLET    Take 100 mg by mouth daily.   CALCIUM CARBONATE 200 MG CAPSULE    Take 250 mg by mouth 2 (two) times daily with a meal.   DIFLUPREDNATE (DUREZOL) 0.05 % EMUL    Apply to eye.   EMTRICITAB-RILPIVIR-TENOFOVIR 200-25-300 MG TABS    Take 1 tablet by mouth daily.   NEBIVOLOL HCL (BYSTOLIC) 20 MG TABS    Take 1 tablet by mouth daily.     SPIRONOLACTONE (ALDACTONE) 12.5 MG TABS    Take 12.5 mg by mouth daily.     VITAMIN D, ERGOCALCIFEROL, (DRISDOL) 50000 UNITS CAPS    Take 2,000 Units by mouth daily.   Modified Medications   No medications on file  Discontinued Medications   No medications on file    Subjective: [name] is in for her routine visit. She has been bothered by some dry skin, dry mouth and dental issues recently. She also recently had some laser surgery on her right eye that improved some cloudy vision. She is tolerating Complera well and has not missed any doses. She states that her blood pressure is always elevated when she sees Dr. Criss Rosales and comes here but that it's not elevated when she takes it at home. She has not had any change in her other medications. Review of Systems: Pertinent items are noted in HPI.  Past Medical History  Diagnosis Date  . Hypertension      History  Substance Use Topics  . Smoking status: Never Smoker   . Smokeless tobacco: Never Used  . Alcohol Use: No    No family history on file.  No Known Allergies  Objective: Temp: 98.6 F (37 C) (01/05 0857) Temp Source: Oral (01/05 0857) BP: 213/82 mmHg (01/05 0857) Pulse Rate: 64 (01/05 0857) Body mass index is 24.98 kg/(m^2).  General: She is smiling and in good spirits Oral: No oropharyngeal lesions. Moist mucosa Skin: No rash Lungs: Clear Cor: Regular S1 and S2 with no murmurs   Lab Results Lab Results  Component Value Date   WBC 4.8 10/16/2014   HGB 11.0* 10/16/2014   HCT 33.0* 10/16/2014   MCV 90.7 10/16/2014   PLT 318 10/16/2014    Lab Results  Component Value Date   CREATININE 1.16* 10/16/2014   BUN 19 10/16/2014   NA 136 10/16/2014   K 4.4 10/16/2014   CL 101 10/16/2014   CO2 28 10/16/2014    Lab Results  Component Value Date   ALT 11 10/16/2014   AST 23 10/16/2014   ALKPHOS 121* 10/16/2014   BILITOT 0.4 10/16/2014    Lab Results  Component Value Date   CHOL 172 07/06/2014   HDL 44 07/06/2014   LDLCALC 113* 07/06/2014  TRIG 75 07/06/2014   CHOLHDL 3.9 07/06/2014    Lab Results HIV 1 RNA QUANT (copies/mL)  Date Value  10/16/2014 <20  07/06/2014 <20  04/03/2014 <20   CD4 T CELL ABS (/uL)  Date Value  07/06/2014 270*  04/03/2014 240*  12/19/2013 270*     Assessment: Her HIV infection remains under excellent control.  Plan: 1. Continue Complera 2. Follow-up after lab work in 3 months   Michel Bickers, MD [facility] for [place] [phone] pager   [phone] cell 10/31/2014, 9:14 AM

## 2014-11-03 ENCOUNTER — Other Ambulatory Visit: Payer: Self-pay | Admitting: *Deleted

## 2014-11-03 DIAGNOSIS — B20 Human immunodeficiency virus [HIV] disease: Secondary | ICD-10-CM

## 2014-11-03 MED ORDER — EMTRICITAB-RILPIVIR-TENOFOV DF 200-25-300 MG PO TABS: 1.0000 | ORAL_TABLET | Freq: Every day | ORAL | Status: DC

## 2014-11-03 NOTE — Telephone Encounter (Signed)
ADAP Application 

## 2014-11-04 ENCOUNTER — Encounter: Payer: Self-pay | Admitting: Internal Medicine

## 2014-11-04 IMAGING — CT CT NECK W/ CM
3 of 4 series · 14 of 33 positions shown, 17 images · IV contrast (omnipaque)
Comparison: None.

CLINICAL DATA: Swollen lymph node or parotid glands started 2 weeks
ago after beginning medication for HIV.

CT NECK WITH CONTRAST
TECHNIQUE: Multidetector CT imaging of the neck was performed with
intravenous contrast.
Contrast: 75mL OMNIPAQUE IOHEXOL 300 MG/ML  SOLN

[Series 4: soft tissue · axial · 0.41mm/px · z∈[+47,+215]mm · 6 of 108 slices shown, 8 images]
[im 12/108  soft-tissue]
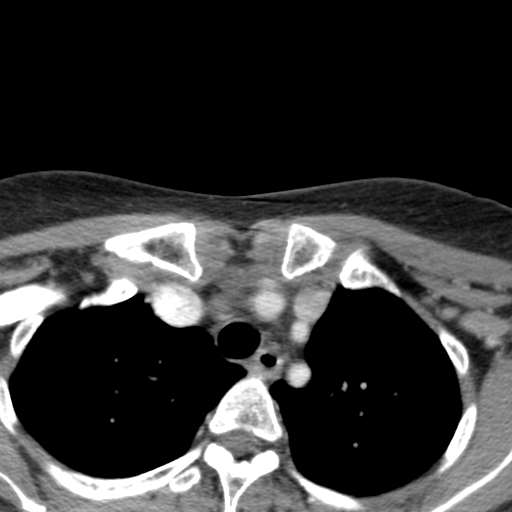
[im 12/108  bone]
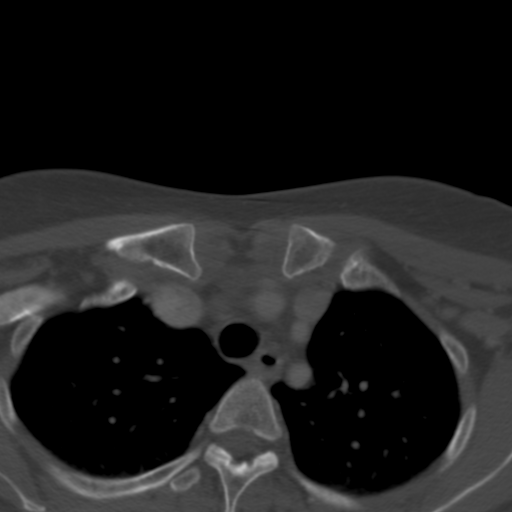
[im 36/108  bone]
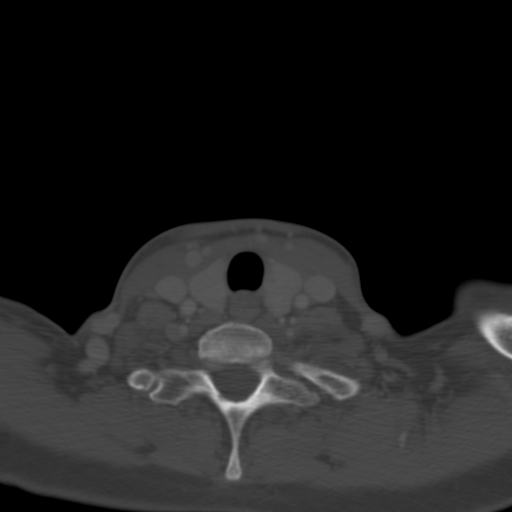
[im 48/108  bone]
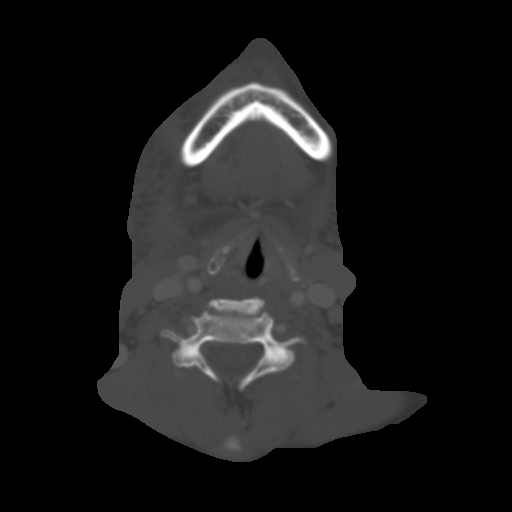
[im 60/108  bone]
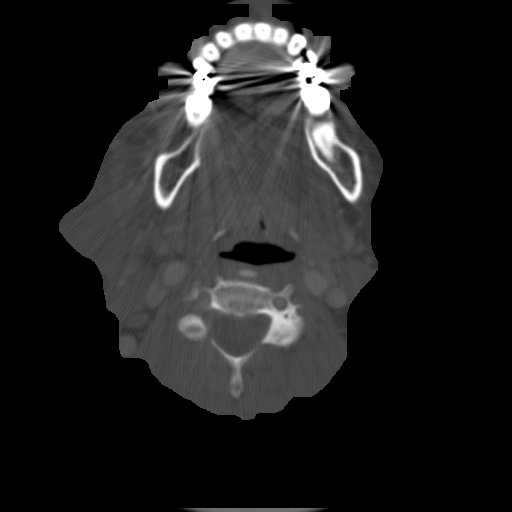
[im 84/108  soft-tissue]
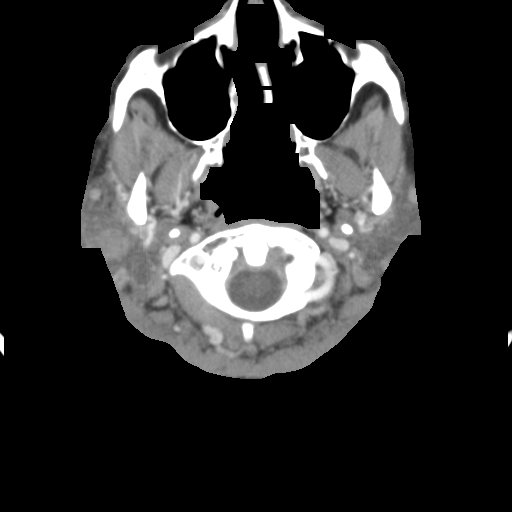
[im 84/108  bone]
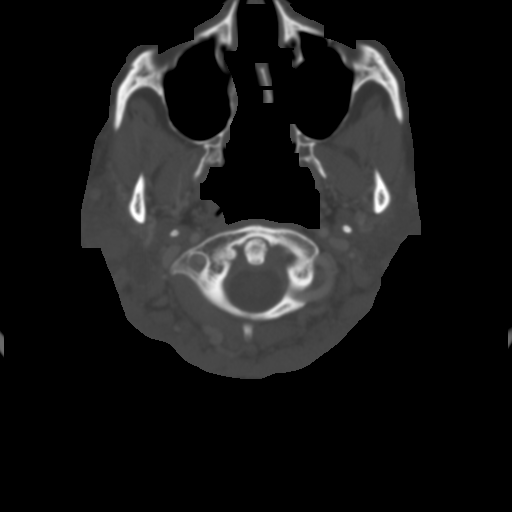
[im 96/108  bone]
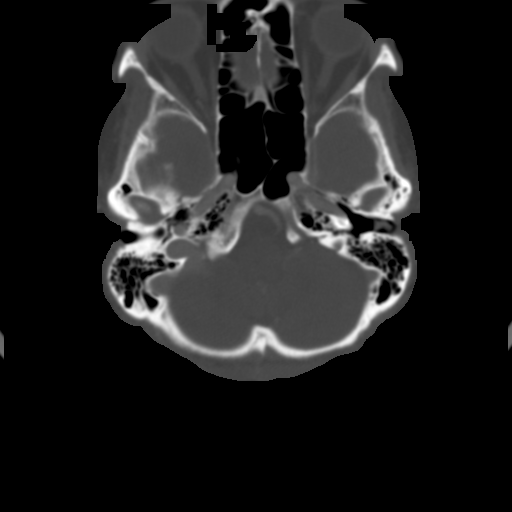

[Series 8060: coronal · coronal · 0.42mm/px · 3 of 81 slices shown]
[im 17/81  bone]
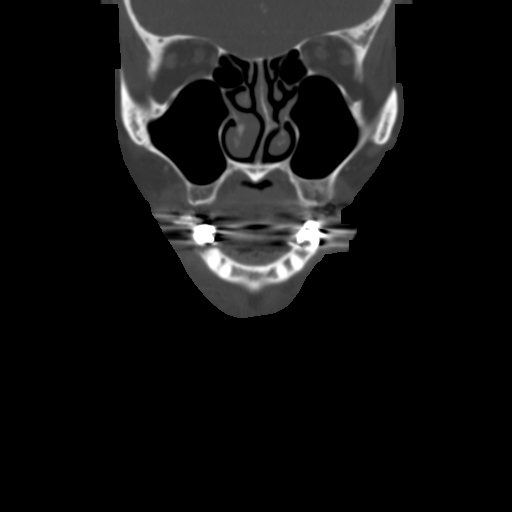
[im 33/81  bone]
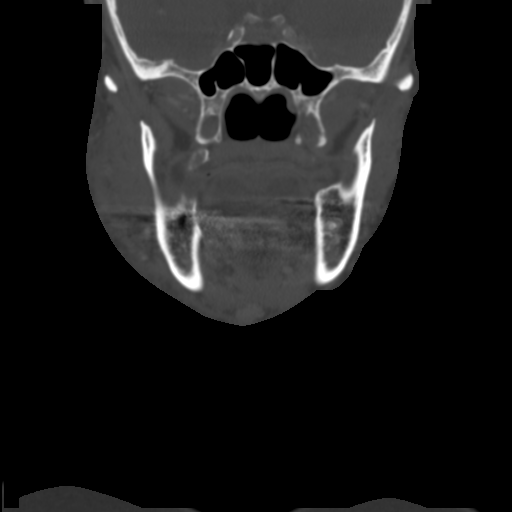
[im 49/81  bone]
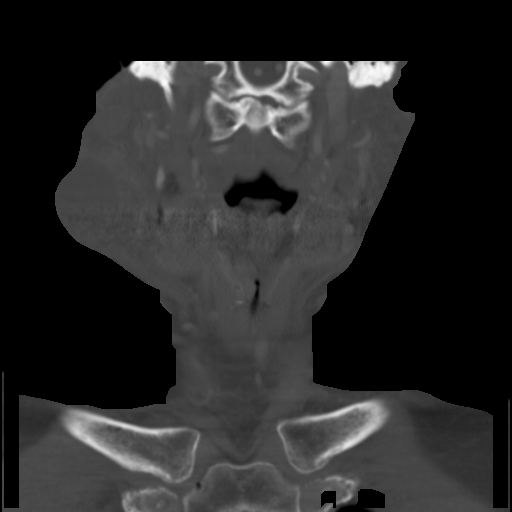

[Series 8061: sagittal · sagittal · 0.42mm/px · 5 of 84 slices shown, 6 images]
[im 28/84  bone]
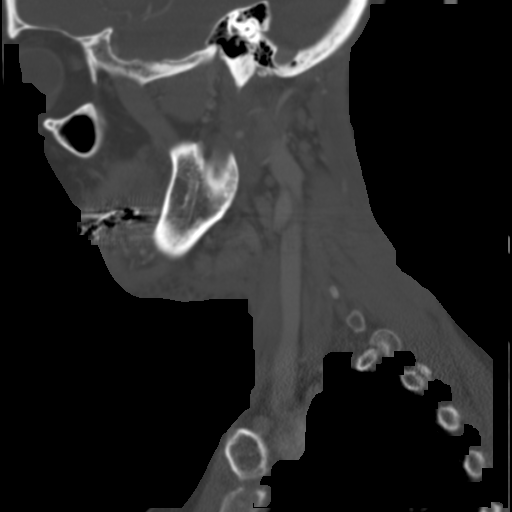
[im 35/84  bone]
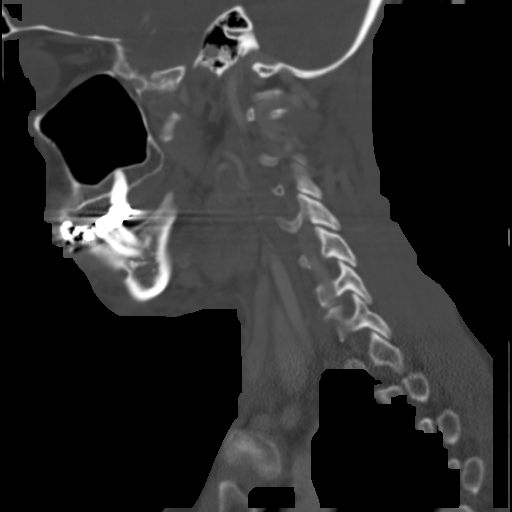
[im 42/84  soft-tissue]
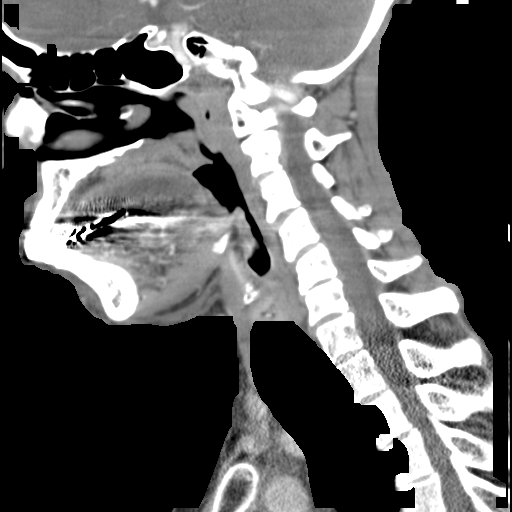
[im 42/84  bone]
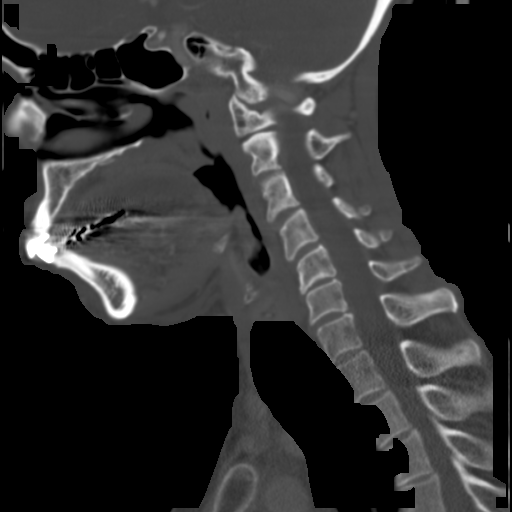
[im 49/84  bone]
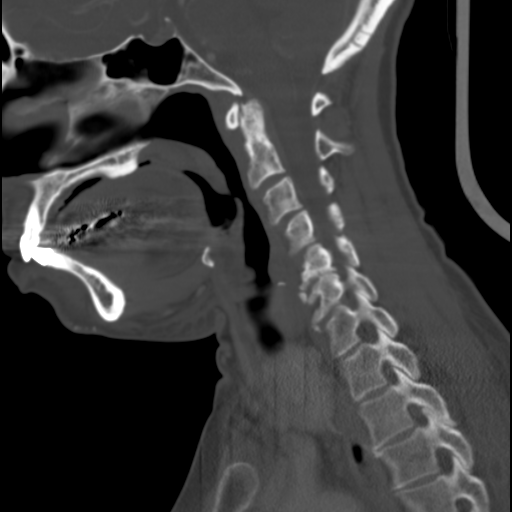
[im 56/84  bone]
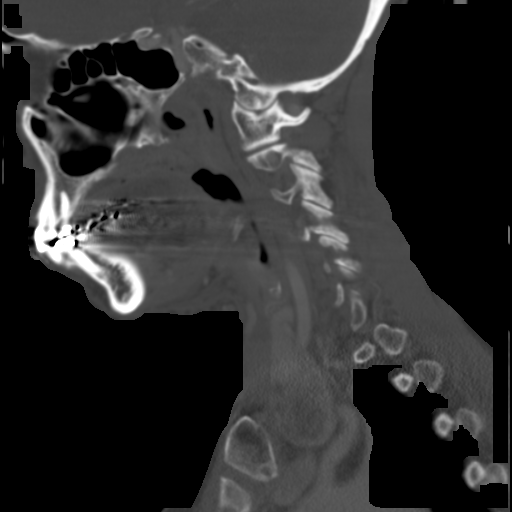

[14 of 33 positions shown; findings below may reference images not displayed]

FINDINGS: 4 x 3.7 x 4.8 cm low density complex cystic-appearing
lesion along the inferior lateral aspect of the parotid gland (and
may originate from the parotid gland).  Peripheral irregular
enhancement and slight nodularity.  Diffuse inflammation of the
right parotid gland and surrounding fat planes with prominent
reticulation.  Inflammation extends along the right platysmus
muscle and superficial to the right sternocleidomastoid muscle.
This may represent a superinfected benign epithelial lesion as can
be seen in patients who are HIV positive.

Within the left parotid gland, 1 cm low density lesion is noted and
may also represent benign lymphoepithelial lesion associated with
HIV.

More solid-appearing lesions are seen within the parotid glands
bilaterally larger on the right measuring up to 1.5 cm.  These
dense parotid lesions may represent complex benign lymphoepithelial
lesions versus adenopathy.  More aggressive primary parotid lesion
not entirely excluded although a secondary consideration.

Left lobe of thyroid 3.2 cm complex mass with substernal extension.
Thyroid ultrasound recommended for further delineation to determine
if this requires fine-needle aspirate to exclude associated
malignancy.

Scattered increased number of lymph nodes throughout the neck
possibly reactive in origin or related to the patient's underlying
immunocompromised state.

No evidence of septic thrombophlebitis of the internal jugular
vein.

Cervical spondylotic changes C5-6.  Mild cord flattening.

Visualized lung apices without worrisome lesion.

Visualized intracranial structures and orbital structures
unremarkable.
IMPRESSION: 4 x 3.7 x 4.8 cm low density complex cystic-appearing lesion along
the inferior lateral aspect of the parotid gland with diffuse
inflammation of the right parotid gland and surrounding fat planes
with prominent reticulation.  Inflammation extends along the right
platysmus muscle and superficial to the right sternocleidomastoid
muscle.  This may represent a superinfected benign epithelial
lesion as can be seen in patients who are HIV positive.

More solid-appearing lesions are seen within the parotid glands
bilaterally larger on the right measuring up to 1.5 cm.  These
dense parotid lesions may represent complex benign lymphoepithelial
lesions versus adenopathy.  More aggressive primary parotid lesion
not entirely excluded although a secondary consideration.

Left lobe of thyroid 3.2 cm complex mass with substernal extension.
Thyroid ultrasound recommended for further delineation to determine
if this requires fine-needle aspirate to exclude associated
malignancy.

Scattered increased number of lymph nodes throughout the neck
possibly reactive in origin or related to the patient's underlying
immunocompromised state.

This has been Maria Prazeres Bukory report utilizing dashboard call
feature..

## 2014-11-10 ENCOUNTER — Other Ambulatory Visit: Payer: Self-pay | Admitting: Licensed Clinical Social Worker

## 2014-11-10 ENCOUNTER — Encounter: Payer: Self-pay | Admitting: Licensed Clinical Social Worker

## 2014-11-10 DIAGNOSIS — B20 Human immunodeficiency virus [HIV] disease: Secondary | ICD-10-CM

## 2014-12-14 ENCOUNTER — Telehealth: Payer: Self-pay | Admitting: *Deleted

## 2014-12-14 ENCOUNTER — Encounter: Payer: Self-pay | Admitting: Internal Medicine

## 2014-12-14 NOTE — Telephone Encounter (Signed)
Pt received a bill for $275 for Zostavax/nurse visit from 01/26/14.  Per [facility] a claim for the visit on 01/26/14 and Zostavax were not sent.  [facility] needs to send a claim to [facility] for payment.  Forwarding this message to Sammuel Bailiff to review.

## 2014-12-20 DIAGNOSIS — R7309 Other abnormal glucose: Secondary | ICD-10-CM | POA: Diagnosis not present

## 2014-12-20 DIAGNOSIS — E782 Mixed hyperlipidemia: Secondary | ICD-10-CM | POA: Diagnosis not present

## 2014-12-20 DIAGNOSIS — I10 Essential (primary) hypertension: Secondary | ICD-10-CM | POA: Diagnosis not present

## 2014-12-20 DIAGNOSIS — B20 Human immunodeficiency virus [HIV] disease: Secondary | ICD-10-CM | POA: Diagnosis not present

## 2014-12-28 NOTE — Telephone Encounter (Signed)
I emailed a copy of her charges for [facility] 01-26-14 to [place] to resubmit to insurance.

## 2015-01-10 ENCOUNTER — Encounter: Payer: Self-pay | Admitting: Internal Medicine

## 2015-01-16 ENCOUNTER — Other Ambulatory Visit: Payer: Medicare Other

## 2015-01-16 DIAGNOSIS — B20 Human immunodeficiency virus [HIV] disease: Secondary | ICD-10-CM

## 2015-01-16 DIAGNOSIS — Z79899 Other long term (current) drug therapy: Secondary | ICD-10-CM

## 2015-01-16 DIAGNOSIS — Z113 Encounter for screening for infections with a predominantly sexual mode of transmission: Secondary | ICD-10-CM

## 2015-01-16 LAB — COMPREHENSIVE METABOLIC PANEL
ALT: 14 U/L (ref 0–35)
AST: 25 U/L (ref 0–37)
Albumin: 4.3 g/dL (ref 3.5–5.2)
Alkaline Phosphatase: 116 U/L (ref 39–117)
BUN: 20 mg/dL (ref 6–23)
CO2: 28 meq/L (ref 19–32)
CREATININE: 1.01 mg/dL (ref 0.50–1.10)
Calcium: 10 mg/dL (ref 8.4–10.5)
Chloride: 102 mEq/L (ref 96–112)
GLUCOSE: 99 mg/dL (ref 70–99)
Potassium: 4.3 mEq/L (ref 3.5–5.3)
Sodium: 135 mEq/L (ref 135–145)
Total Bilirubin: 0.4 mg/dL (ref 0.2–1.2)
Total Protein: 7.7 g/dL (ref 6.0–8.3)

## 2015-01-16 LAB — CBC
HCT: 31.1 % — ABNORMAL LOW (ref 36.0–46.0)
Hemoglobin: 10.3 g/dL — ABNORMAL LOW (ref 12.0–15.0)
MCH: 29.7 pg (ref 26.0–34.0)
MCHC: 33.1 g/dL (ref 30.0–36.0)
MCV: 89.6 fL (ref 78.0–100.0)
MPV: 9.3 fL (ref 8.6–12.4)
PLATELETS: 290 10*3/uL (ref 150–400)
RBC: 3.47 MIL/uL — ABNORMAL LOW (ref 3.87–5.11)
RDW: 14 % (ref 11.5–15.5)
WBC: 5.4 10*3/uL (ref 4.0–10.5)

## 2015-01-16 LAB — LIPID PANEL
CHOLESTEROL: 172 mg/dL (ref 0–200)
HDL: 43 mg/dL — ABNORMAL LOW (ref >=46)
LDL Cholesterol: 109 mg/dL — ABNORMAL HIGH (ref 0–99)
Total CHOL/HDL Ratio: 4 Ratio
Triglycerides: 99 mg/dL (ref <150)
VLDL: 20 mg/dL (ref 0–40)

## 2015-01-17 LAB — RPR

## 2015-01-17 LAB — T-HELPER CELL (CD4) - (RCID CLINIC ONLY)
CD4 % Helper T Cell: 26 % — ABNORMAL LOW (ref 33–55)
CD4 T CELL ABS: 410 /uL (ref 400–2700)

## 2015-01-18 LAB — HIV-1 RNA QUANT-NO REFLEX-BLD
HIV 1 RNA Quant: <20 copies/mL (ref <20)
HIV-1 RNA Quant, Log: <1.3 {Log} (ref <1.30)

## 2015-01-30 ENCOUNTER — Encounter: Payer: Self-pay | Admitting: *Deleted

## 2015-01-30 ENCOUNTER — Ambulatory Visit (INDEPENDENT_AMBULATORY_CARE_PROVIDER_SITE_OTHER): Payer: Medicare Other | Admitting: Internal Medicine

## 2015-01-30 ENCOUNTER — Encounter: Payer: Self-pay | Admitting: Internal Medicine

## 2015-01-30 DIAGNOSIS — B20 Human immunodeficiency virus [HIV] disease: Secondary | ICD-10-CM

## 2015-01-30 NOTE — Progress Notes (Signed)
Patient ID: Madison Newman, female   DOB: May 26, 1941, 74 y.o.   MRN: [id number]          Patient Active Problem List   Diagnosis Date Noted  . Human immunodeficiency virus (HIV) disease 12/14/2006    Priority: High  . Face lesion 05/19/2013  . Cataracts, bilateral 08/12/2012  . 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 ANXIETY 12/07/2007  . LUMPECTOMY, BREAST, HX OF 12/16/2006  . TAH/BSO, HX OF 12/16/2006  . HYPERLIPIDEMIA 12/14/2006  . ANEMIA NEC 12/14/2006  . IRITIS 12/14/2006  . HYPERTENSION 12/14/2006  . REFLUX ESOPHAGITIS 12/14/2006  . BREAST CANCER, HX OF 12/14/2006    Patient's Medications  New Prescriptions   No medications on file  Previous Medications   ASCORBIC ACID (VITAMIN C) 100 MG TABLET    Take 100 mg by mouth daily.   CALCIUM CARBONATE 200 MG CAPSULE    Take 250 mg by mouth 2 (two) times daily with a meal.   DIFLUPREDNATE (DUREZOL) 0.05 % EMUL    Apply to eye.   EMTRICITAB-RILPIVIR-TENOFOVIR 200-25-300 MG TABS    Take 1 tablet by mouth daily.   NEBIVOLOL HCL (BYSTOLIC) 20 MG TABS    Take 1 tablet by mouth daily.     SPIRONOLACTONE (ALDACTONE) 12.5 MG TABS    Take 12.5 mg by mouth daily.     VITAMIN D, ERGOCALCIFEROL, (DRISDOL) 50000 UNITS CAPS    Take 2,000 Units by mouth daily.   Modified Medications   No medications on file  Discontinued Medications   No medications on file    Subjective: Madison Newman is in for her routine visit. She has no problems tolerating her Complera and does not miss doses.  Pertinent items are noted in HPI.  Past Medical History  Diagnosis Date  . Hypertension     History  Substance Use Topics  . Smoking status: Never Smoker   . Smokeless tobacco: Never Used  . Alcohol Use: No    No family history on file.  No Known Allergies  Objective: Temp: 98.1 F (36.7 C) (04/05 0903) Temp Source: Oral (04/05 0903) BP: 199/79 mmHg (04/05 0903) Pulse Rate: 62 (04/05 0903) Body mass index is 24.65 kg/(m^2).  General: She is in good  spirits Oral: No oropharyngeal lesions. Moist mucosa Skin: No rash Lungs: Clear Cor: Regular S1 and S2 with no murmurs   Lab Results Lab Results  Component Value Date   WBC 5.4 01/16/2015   HGB 10.3* 01/16/2015   HCT 31.1* 01/16/2015   MCV 89.6 01/16/2015   PLT 290 01/16/2015    Lab Results  Component Value Date   CREATININE 1.01 01/16/2015   BUN 20 01/16/2015   NA 135 01/16/2015   K 4.3 01/16/2015   CL 102 01/16/2015   CO2 28 01/16/2015    Lab Results  Component Value Date   ALT 14 01/16/2015   AST 25 01/16/2015   ALKPHOS 116 01/16/2015   BILITOT 0.4 01/16/2015    Lab Results  Component Value Date   CHOL 172 01/16/2015   HDL 43* 01/16/2015   LDLCALC 109* 01/16/2015   TRIG 99 01/16/2015   CHOLHDL 4.0 01/16/2015    Lab Results HIV 1 RNA QUANT (copies/mL)  Date Value  01/16/2015 <20  10/16/2014 <20  07/06/2014 <20   CD4 T CELL ABS (/uL)  Date Value  01/16/2015 410  07/06/2014 270*  04/03/2014 240*     Assessment: Her HIV infection remains under excellent control and her CD4 count has returned to normal.  Plan: 1. Continue Complera 2. Follow-up after lab work in [place] months   Michel Bickers, [place] for [place] [phone] pager   [phone] cell 01/30/2015, 9:13 AM

## 2015-03-22 ENCOUNTER — Other Ambulatory Visit: Payer: Self-pay | Admitting: Internal Medicine

## 2015-03-22 DIAGNOSIS — B20 Human immunodeficiency virus [HIV] disease: Secondary | ICD-10-CM

## 2015-03-23 ENCOUNTER — Encounter: Payer: Self-pay | Admitting: Internal Medicine

## 2015-05-24 DIAGNOSIS — B20 Human immunodeficiency virus [HIV] disease: Secondary | ICD-10-CM | POA: Diagnosis not present

## 2015-05-24 DIAGNOSIS — E782 Mixed hyperlipidemia: Secondary | ICD-10-CM | POA: Diagnosis not present

## 2015-05-24 DIAGNOSIS — I10 Essential (primary) hypertension: Secondary | ICD-10-CM | POA: Diagnosis not present

## 2015-06-26 ENCOUNTER — Encounter: Payer: Self-pay | Admitting: Internal Medicine

## 2015-07-21 ENCOUNTER — Encounter: Payer: Self-pay | Admitting: Internal Medicine

## 2015-07-25 ENCOUNTER — Ambulatory Visit: Payer: Self-pay

## 2015-07-26 ENCOUNTER — Ambulatory Visit: Payer: Self-pay

## 2015-07-31 ENCOUNTER — Other Ambulatory Visit: Payer: Medicare Other

## 2015-07-31 DIAGNOSIS — B20 Human immunodeficiency virus [HIV] disease: Secondary | ICD-10-CM | POA: Diagnosis not present

## 2015-08-01 ENCOUNTER — Other Ambulatory Visit: Payer: Self-pay

## 2015-08-01 LAB — T-HELPER CELL (CD4) - (RCID CLINIC ONLY)
CD4 % Helper T Cell: 28 % — ABNORMAL LOW (ref 33–55)
CD4 T Cell Abs: 440 /uL (ref 400–2700)

## 2015-08-01 LAB — HIV-1 RNA QUANT-NO REFLEX-BLD
HIV 1 RNA Quant: <20 copies/mL (ref <20)
HIV-1 RNA Quant, Log: <1.3 {Log} (ref <1.30)

## 2015-08-14 ENCOUNTER — Encounter: Payer: Self-pay | Admitting: Internal Medicine

## 2015-08-14 ENCOUNTER — Ambulatory Visit (INDEPENDENT_AMBULATORY_CARE_PROVIDER_SITE_OTHER): Payer: Medicare Other | Admitting: Internal Medicine

## 2015-08-14 VITALS — BP 187/71 | HR 61 | Temp 98.2°F | Ht 65.5 in | Wt 144.8 lb

## 2015-08-14 DIAGNOSIS — Z79899 Other long term (current) drug therapy: Secondary | ICD-10-CM

## 2015-08-14 DIAGNOSIS — Z23 Encounter for immunization: Secondary | ICD-10-CM | POA: Diagnosis not present

## 2015-08-14 DIAGNOSIS — B20 Human immunodeficiency virus [HIV] disease: Secondary | ICD-10-CM | POA: Diagnosis not present

## 2015-08-14 NOTE — Assessment & Plan Note (Signed)
Her HIV infection has come under excellent control since starting Complera a little over 2 years ago. Her CD4 count has now reconstituted to normal. I reassured her that I have not seen any evidence of adverse reactions to Complera in her lab work or her exam. I do not think the leg cramps are likely to be due to [place]. I have given her written information about nocturnal leg cramps and encouraged her to stay well hydrated. She will follow-up here after a full set of lab work in 6 months.

## 2015-08-14 NOTE — Progress Notes (Signed)
Patient ID: Madison Newman, female   DOB: 01-Aug-1941, 74 y.o.   MRN: [id number]          Patient Active Problem List   Diagnosis Date Noted  . Human immunodeficiency virus (HIV) disease ([place]) 12/14/2006    Priority: High  . Face lesion 05/19/2013  . Cataracts, bilateral 08/12/2012  . 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 ANXIETY 12/07/2007  . LUMPECTOMY, BREAST, HX OF 12/16/2006  . TAH/BSO, HX OF 12/16/2006  . HYPERLIPIDEMIA 12/14/2006  . ANEMIA NEC 12/14/2006  . IRITIS 12/14/2006  . HYPERTENSION 12/14/2006  . REFLUX ESOPHAGITIS 12/14/2006  . BREAST CANCER, HX OF 12/14/2006    Patient's Medications  New Prescriptions   No medications on file  Previous Medications   ASCORBIC ACID (VITAMIN C) 100 MG TABLET    Take 100 mg by mouth daily.   CALCIUM CARBONATE 200 MG CAPSULE    Take 250 mg by mouth 2 (two) times daily with a meal.   COMPLERA 200-25-300 MG TABS    TAKE 1 TABLET BY MOUTH DAILY   DIFLUPREDNATE (DUREZOL) 0.05 % EMUL    Apply to eye.   NEBIVOLOL HCL (BYSTOLIC) 20 MG TABS    Take 1 tablet by mouth daily.     SPIRONOLACTONE (ALDACTONE) 12.5 MG TABS    Take 12.5 mg by mouth daily.     VITAMIN D, ERGOCALCIFEROL, (DRISDOL) 50000 UNITS CAPS    Take 2,000 Units by mouth daily.   Modified Medications   No medications on file  Discontinued Medications   No medications on file    Subjective: [name] is in for her routine HIV follow-up visit. She denies missing any doses of her Complera. She denies having any problems tolerating Complera when she takes it but she still worries that it might be causing some damage to her liver or kidneys. Over the past 2 months she has been having problems with nocturnal leg cramps. They occur almost every night. They usually last about 1 minute. She will sometimes have to get out of bed to stretch. She has not had any change in her activity that could've precipitated the cramps. She worries that she may not be drinking enough water. She continues to play golf  frequently. She states that she measures her blood pressures frequently at home and they are always lower than when she is at the doctor's office.   Review of Systems: Pertinent items are noted in HPI.  Past Medical History  Diagnosis Date  . Hypertension     Social History  Substance Use Topics  . Smoking status: Never Smoker   . Smokeless tobacco: Never Used  . Alcohol Use: No    No family history on file.  No Known Allergies  Objective:  Filed Vitals:   08/14/15 1005  BP: 187/71  Pulse: 61  Temp: 98.2 F (36.8 C)  TempSrc: Oral  Height: 5' 5.5" (1.664 m)  Weight: 144 lb 12.8 oz (65.681 kg)   Body mass index is 23.72 kg/(m^2).  General: She is well dressed and in good spirits Oral: No oropharyngeal lesions Skin: No rash Lungs: Clear Cor: Regular S1 and S2 with no murmurs  Lab Results Lab Results  Component Value Date   WBC 5.4 01/16/2015   HGB 10.3* 01/16/2015   HCT 31.1* 01/16/2015   MCV 89.6 01/16/2015   PLT 290 01/16/2015    Lab Results  Component Value Date   CREATININE 1.01 01/16/2015   BUN 20 01/16/2015   NA 135 01/16/2015  K 4.3 01/16/2015   CL 102 01/16/2015   CO2 28 01/16/2015    Lab Results  Component Value Date   ALT 14 01/16/2015   AST 25 01/16/2015   ALKPHOS 116 01/16/2015   BILITOT 0.4 01/16/2015    Lab Results  Component Value Date   CHOL 172 01/16/2015   HDL 43* 01/16/2015   LDLCALC 109* 01/16/2015   TRIG 99 01/16/2015   CHOLHDL 4.0 01/16/2015    Lab Results HIV 1 RNA QUANT (copies/mL)  Date Value  07/31/2015 <20  01/16/2015 <20  10/16/2014 <20   CD4 T CELL ABS (/uL)  Date Value  07/31/2015 440  01/16/2015 410  07/06/2014 270*     Problem List Items Addressed This Visit      High   Human immunodeficiency virus (HIV) disease ([place])    Her HIV infection has come under excellent control since starting Complera a little over 2 years ago. Her CD4 count has now reconstituted to normal. I reassured her that I have  not seen any evidence of adverse reactions to Complera in her lab work or her exam. I do not think the leg cramps are likely to be due to [place]. I have given her written information about nocturnal leg cramps and encouraged her to stay well hydrated. She will follow-up here after a full set of lab work in 6 months.      Relevant Orders   T-helper cell (CD4)- ([facility] clinic only)   HIV 1 RNA quant-no reflex-bld   CBC   Comprehensive metabolic panel   Lipid panel   RPR    Other Visit Diagnoses    Encounter for long-term (current) use of medications    -  Primary    Relevant Orders    Lipid panel         Michel Bickers, MD [facility] for [place] [phone] pager   [phone] cell 08/14/2015, 10:35 AM

## 2015-09-05 NOTE — Progress Notes (Signed)
[facility] notified via fax. Landis Gandy, RN

## 2015-09-27 DIAGNOSIS — Z0000 Encounter for general adult medical examination without abnormal findings: Secondary | ICD-10-CM | POA: Diagnosis not present

## 2015-10-17 DIAGNOSIS — H0011 Chalazion right upper eyelid: Secondary | ICD-10-CM | POA: Diagnosis not present

## 2015-10-17 DIAGNOSIS — Z961 Presence of intraocular lens: Secondary | ICD-10-CM | POA: Diagnosis not present

## 2015-10-17 DIAGNOSIS — H02403 Unspecified ptosis of bilateral eyelids: Secondary | ICD-10-CM | POA: Diagnosis not present

## 2015-10-17 DIAGNOSIS — H31093 Other chorioretinal scars, bilateral: Secondary | ICD-10-CM | POA: Diagnosis not present

## 2015-10-17 DIAGNOSIS — H02831 Dermatochalasis of right upper eyelid: Secondary | ICD-10-CM | POA: Diagnosis not present

## 2015-10-17 DIAGNOSIS — H02834 Dermatochalasis of left upper eyelid: Secondary | ICD-10-CM | POA: Diagnosis not present

## 2015-10-30 DIAGNOSIS — R7309 Other abnormal glucose: Secondary | ICD-10-CM | POA: Diagnosis not present

## 2015-10-30 DIAGNOSIS — I10 Essential (primary) hypertension: Secondary | ICD-10-CM | POA: Diagnosis not present

## 2015-10-30 DIAGNOSIS — B20 Human immunodeficiency virus [HIV] disease: Secondary | ICD-10-CM | POA: Diagnosis not present

## 2016-01-04 ENCOUNTER — Encounter: Payer: Self-pay | Admitting: Internal Medicine

## 2016-02-04 ENCOUNTER — Encounter: Payer: Self-pay | Admitting: Internal Medicine

## 2016-02-16 ENCOUNTER — Encounter: Payer: Self-pay | Admitting: Internal Medicine

## 2016-02-19 ENCOUNTER — Other Ambulatory Visit: Payer: Medicare Other

## 2016-02-19 DIAGNOSIS — Z79899 Other long term (current) drug therapy: Secondary | ICD-10-CM

## 2016-02-19 DIAGNOSIS — B20 Human immunodeficiency virus [HIV] disease: Secondary | ICD-10-CM

## 2016-02-19 LAB — LIPID PANEL
Cholesterol: 192 mg/dL (ref 125–200)
HDL: 50 mg/dL (ref >=46)
LDL CALC: 127 mg/dL (ref <130)
TRIGLYCERIDES: 77 mg/dL (ref <150)
Total CHOL/HDL Ratio: 3.8 Ratio (ref <=5.0)
VLDL: 15 mg/dL (ref <30)

## 2016-02-19 LAB — CBC
HCT: 33.6 % — ABNORMAL LOW (ref 35.0–45.0)
Hemoglobin: 10.9 g/dL — ABNORMAL LOW (ref 11.7–15.5)
MCH: 29 pg (ref 27.0–33.0)
MCHC: 32.4 g/dL (ref 32.0–36.0)
MCV: 89.4 fL (ref 80.0–100.0)
MPV: 9.2 fL (ref 7.5–12.5)
PLATELETS: 301 10*3/uL (ref 140–400)
RBC: 3.76 MIL/uL — ABNORMAL LOW (ref 3.80–5.10)
RDW: 14 % (ref 11.0–15.0)
WBC: 4.6 10*3/uL (ref 3.8–10.8)

## 2016-02-19 LAB — COMPREHENSIVE METABOLIC PANEL
ALBUMIN: 3.8 g/dL (ref 3.6–5.1)
ALT: 14 U/L (ref 6–29)
AST: 21 U/L (ref 10–35)
Alkaline Phosphatase: 106 U/L (ref 33–130)
BUN: 18 mg/dL (ref 7–25)
CALCIUM: 9.9 mg/dL (ref 8.6–10.4)
CHLORIDE: 103 mmol/L (ref 98–110)
CO2: 25 mmol/L (ref 20–31)
Creat: 1.01 mg/dL — ABNORMAL HIGH (ref 0.60–0.93)
Glucose, Bld: 73 mg/dL (ref 65–99)
Potassium: 4.8 mmol/L (ref 3.5–5.3)
Sodium: 138 mmol/L (ref 135–146)
Total Bilirubin: 0.4 mg/dL (ref 0.2–1.2)
Total Protein: 7.6 g/dL (ref 6.1–8.1)

## 2016-02-20 LAB — HIV-1 RNA QUANT-NO REFLEX-BLD

## 2016-02-20 LAB — RPR

## 2016-02-20 LAB — T-HELPER CELL (CD4) - (RCID CLINIC ONLY)
CD4 T CELL ABS: 350 /uL — AB (ref 400–2700)
CD4 T CELL HELPER: 25 % — AB (ref 33–55)

## 2016-02-21 ENCOUNTER — Other Ambulatory Visit: Payer: Self-pay

## 2016-03-06 ENCOUNTER — Encounter: Payer: Self-pay | Admitting: Internal Medicine

## 2016-03-06 ENCOUNTER — Ambulatory Visit (INDEPENDENT_AMBULATORY_CARE_PROVIDER_SITE_OTHER): Payer: Medicare Other | Admitting: Internal Medicine

## 2016-03-06 DIAGNOSIS — B20 Human immunodeficiency virus [HIV] disease: Secondary | ICD-10-CM | POA: Diagnosis not present

## 2016-03-06 MED ORDER — EMTRICITAB-RILPIVIR-TENOFOV AF 200-25-25 MG PO TABS: 1.0000 | ORAL_TABLET | Freq: Every day | ORAL | Status: DC

## 2016-03-06 NOTE — Progress Notes (Signed)
Patient Active Problem List   Diagnosis Date Noted  . Human immunodeficiency virus (HIV) disease ([place]) 12/14/2006    Priority: High  . Face lesion 05/19/2013  . Cataracts, bilateral 08/12/2012  . 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 ANXIETY 12/07/2007  . LUMPECTOMY, BREAST, HX OF 12/16/2006  . TAH/BSO, HX OF 12/16/2006  . HYPERLIPIDEMIA 12/14/2006  . ANEMIA NEC 12/14/2006  . IRITIS 12/14/2006  . HYPERTENSION 12/14/2006  . REFLUX ESOPHAGITIS 12/14/2006  . BREAST CANCER, HX OF 12/14/2006    Patient's Medications  New Prescriptions   EMTRICITABINE-RILPIVIR-TENOFOVIR AF (ODEFSEY) 200-25-25 MG TABS TABLET    Take 1 tablet by mouth daily.  Previous Medications   ASCORBIC ACID (VITAMIN C) 100 MG TABLET    Take 100 mg by mouth daily.   CALCIUM CARBONATE 200 MG CAPSULE    Take 250 mg by mouth 2 (two) times daily with a meal.   DIFLUPREDNATE (DUREZOL) 0.05 % EMUL    Apply to eye.   NEBIVOLOL HCL (BYSTOLIC) 20 MG TABS    Take 1 tablet by mouth daily.     SPIRONOLACTONE (ALDACTONE) 12.5 MG TABS    Take 12.5 mg by mouth daily.     VITAMIN D, ERGOCALCIFEROL, (DRISDOL) 50000 UNITS CAPS    Take 2,000 Units by mouth daily.   Modified Medications   No medications on file  Discontinued Medications   COMPLERA 200-25-300 MG TABS    TAKE 1 TABLET BY MOUTH DAILY    Subjective: Madison Newman is in for her routine HIV follow-up visit. As usual she does not miss doses of her Complera. She is not having any problems tolerating it.   Review of Systems: Review of Systems  Constitutional: Negative for fever, chills, weight loss, malaise/fatigue and diaphoresis.  HENT: Negative for sore throat.        She recently developed a site on her right upper eyelid. It is slowly resolving.  Respiratory: Negative for cough, sputum production and shortness of breath.   Cardiovascular: Negative for chest pain.  Gastrointestinal: Negative for nausea, vomiting and diarrhea.  Genitourinary: Negative for dysuria and  frequency.  Musculoskeletal: Negative for myalgias and joint pain.       She continues to be bothered by chronic, intermittent leg cramps.  Skin: Negative for rash.  Neurological: Negative for dizziness and headaches.  Psychiatric/Behavioral: Negative for depression and substance abuse. The patient is not nervous/anxious.     Past Medical History  Diagnosis Date  . Hypertension     Social History  Substance Use Topics  . Smoking status: Never Smoker   . Smokeless tobacco: Never Used  . Alcohol Use: No    No family history on file.  No Known Allergies  Objective:  Filed Vitals:   03/06/16 0844  BP: 197/71  Pulse: 67  Temp: 98.8 F (37.1 C)  TempSrc: Oral  Weight: 138 lb (62.596 kg)   Body mass index is 22.61 kg/(m^2).  Physical Exam  Constitutional: She is oriented to person, place, and time. No distress.  HENT:  Mouth/Throat: No oropharyngeal exudate.  Eyes: Conjunctivae are normal.  Small stye on right upper eyelid.  Cardiovascular: Normal rate and regular rhythm.   No murmur heard. Pulmonary/Chest: Breath sounds normal.  Abdominal: Soft. She exhibits no mass. There is no tenderness.  Musculoskeletal: Normal range of motion.  Neurological: She is alert and oriented to person, place, and time. Gait normal.  Skin: No rash noted.  Psychiatric: Mood and affect normal.  Lab Results Lab Results  Component Value Date   WBC 4.6 02/19/2016   HGB 10.9* 02/19/2016   HCT 33.6* 02/19/2016   MCV 89.4 02/19/2016   PLT 301 02/19/2016    Lab Results  Component Value Date   CREATININE 1.01* 02/19/2016   BUN 18 02/19/2016   NA 138 02/19/2016   K 4.8 02/19/2016   CL 103 02/19/2016   CO2 25 02/19/2016    Lab Results  Component Value Date   ALT 14 02/19/2016   AST 21 02/19/2016   ALKPHOS 106 02/19/2016   BILITOT 0.4 02/19/2016    Lab Results  Component Value Date   CHOL 192 02/19/2016   HDL 50 02/19/2016   LDLCALC 127 02/19/2016   TRIG 77 02/19/2016    CHOLHDL 3.8 02/19/2016    Lab Results HIV 1 RNA QUANT (copies/mL)  Date Value  02/19/2016 <20  07/31/2015 <20  01/16/2015 <20   CD4 T CELL ABS (/uL)  Date Value  02/19/2016 350*  07/31/2015 440  01/16/2015 410      Problem List Items Addressed This Visit      High   Human immunodeficiency virus (HIV) disease ([place])    Her infection remains under excellent control and she's had steady CD4 reconstitution since starting Complera several years ago. She has very mild renal insufficiency. I will change her to the new TAF preparation, Odefsey.      Relevant Medications   emtricitabine-rilpivir-tenofovir AF (ODEFSEY) 200-25-25 MG TABS tablet   Other Relevant Orders   T-helper cell (CD4)- ([facility] clinic only)   HIV 1 RNA quant-no reflex-bld   Comprehensive metabolic panel   CBC        Michel Bickers, MD [facility] for [place] [phone] pager   [phone] cell 03/06/2016, 9:05 AM

## 2016-03-06 NOTE — Assessment & Plan Note (Signed)
Her infection remains under excellent control and she's had steady CD4 reconstitution since starting Complera several years ago. She has very mild renal insufficiency. I will change her to the new TAF preparation, Odefsey.

## 2016-03-07 ENCOUNTER — Telehealth: Payer: Self-pay | Admitting: *Deleted

## 2016-03-07 ENCOUNTER — Encounter: Payer: Self-pay | Admitting: Internal Medicine

## 2016-03-07 NOTE — Telephone Encounter (Signed)
Expedited PA needed for [facility], completed via phone.  Pt has only a limited number of "old" HIV medication on hand.  Response from [facility] may take up to 24 hours.  PA# [id number].  Shared PA number with [facility].

## 2016-03-10 NOTE — Telephone Encounter (Signed)
PA approved through 06/07/16 for [facility].

## 2016-03-21 ENCOUNTER — Encounter (HOSPITAL_COMMUNITY): Payer: Self-pay | Admitting: Pharmacist Clinician (PhC)/ Clinical Pharmacy Specialist

## 2016-03-21 ENCOUNTER — Encounter: Payer: Self-pay | Admitting: Internal Medicine

## 2016-03-29 DIAGNOSIS — R040 Epistaxis: Secondary | ICD-10-CM | POA: Diagnosis not present

## 2016-03-29 DIAGNOSIS — I10 Essential (primary) hypertension: Secondary | ICD-10-CM | POA: Diagnosis not present

## 2016-03-29 DIAGNOSIS — E782 Mixed hyperlipidemia: Secondary | ICD-10-CM | POA: Diagnosis not present

## 2016-03-29 DIAGNOSIS — B20 Human immunodeficiency virus [HIV] disease: Secondary | ICD-10-CM | POA: Diagnosis not present

## 2016-03-29 DIAGNOSIS — Z79899 Other long term (current) drug therapy: Secondary | ICD-10-CM | POA: Diagnosis not present

## 2016-04-18 DIAGNOSIS — I10 Essential (primary) hypertension: Secondary | ICD-10-CM | POA: Diagnosis not present

## 2016-04-18 DIAGNOSIS — Z6822 Body mass index (BMI) 22.0-22.9, adult: Secondary | ICD-10-CM | POA: Diagnosis not present

## 2016-04-28 ENCOUNTER — Encounter: Payer: Self-pay | Admitting: Internal Medicine

## 2016-04-28 ENCOUNTER — Telehealth: Payer: Self-pay | Admitting: Pharmacist

## 2016-04-28 NOTE — Telephone Encounter (Signed)
Spoke with [place] today regarding her medications. She recently started Amlodipine 2.5 mg daily and is not feeling well since starting. She said she had an episode of chest tightness yesterday morning at church.  Instructed her that her side effects are probably due to starting or being on 3 blood pressure medications and to give it some time.  Her symptoms should not be from [facility]. Instructed her to go to the ED if she ever had severe chest pain that did not resolve. She feels more relieved after speaking with me.  Ommie Degeorge L. Nicole Kindred, PharmD Infectious Diseases Clinical Pharmacist Pager: [phone] 04/28/2016 4:28 PM

## 2016-06-19 ENCOUNTER — Ambulatory Visit: Payer: Medicare Other

## 2016-06-20 DIAGNOSIS — I10 Essential (primary) hypertension: Secondary | ICD-10-CM | POA: Diagnosis not present

## 2016-06-20 DIAGNOSIS — E782 Mixed hyperlipidemia: Secondary | ICD-10-CM | POA: Diagnosis not present

## 2016-06-20 DIAGNOSIS — B20 Human immunodeficiency virus [HIV] disease: Secondary | ICD-10-CM | POA: Diagnosis not present

## 2016-08-18 ENCOUNTER — Other Ambulatory Visit: Payer: Medicare Other

## 2016-08-18 DIAGNOSIS — B20 Human immunodeficiency virus [HIV] disease: Secondary | ICD-10-CM | POA: Diagnosis not present

## 2016-08-18 LAB — CBC
HCT: 33.4 % — ABNORMAL LOW (ref 35.0–45.0)
HEMOGLOBIN: 10.8 g/dL — AB (ref 11.7–15.5)
MCH: 29.4 pg (ref 27.0–33.0)
MCHC: 32.3 g/dL (ref 32.0–36.0)
MCV: 91 fL (ref 80.0–100.0)
MPV: 9.3 fL (ref 7.5–12.5)
Platelets: 337 10*3/uL (ref 140–400)
RBC: 3.67 MIL/uL — AB (ref 3.80–5.10)
RDW: 14.2 % (ref 11.0–15.0)
WBC: 5.7 10*3/uL (ref 3.8–10.8)

## 2016-08-18 LAB — COMPREHENSIVE METABOLIC PANEL
ALK PHOS: 106 U/L (ref 33–130)
ALT: 12 U/L (ref 6–29)
AST: 23 U/L (ref 10–35)
Albumin: 4.1 g/dL (ref 3.6–5.1)
BUN: 18 mg/dL (ref 7–25)
CALCIUM: 10.4 mg/dL (ref 8.6–10.4)
CHLORIDE: 101 mmol/L (ref 98–110)
CO2: 27 mmol/L (ref 20–31)
Creat: 1.06 mg/dL — ABNORMAL HIGH (ref 0.60–0.93)
GLUCOSE: 101 mg/dL — AB (ref 65–99)
POTASSIUM: 4.3 mmol/L (ref 3.5–5.3)
Sodium: 136 mmol/L (ref 135–146)
TOTAL PROTEIN: 7.8 g/dL (ref 6.1–8.1)
Total Bilirubin: 0.3 mg/dL (ref 0.2–1.2)

## 2016-08-19 LAB — T-HELPER CELL (CD4) - (RCID CLINIC ONLY)
CD4 % Helper T Cell: 25 % — ABNORMAL LOW (ref 33–55)
CD4 T Cell Abs: 380 /uL — ABNORMAL LOW (ref 400–2700)

## 2016-08-20 LAB — HIV-1 RNA QUANT-NO REFLEX-BLD

## 2016-09-01 ENCOUNTER — Encounter: Payer: Self-pay | Admitting: Internal Medicine

## 2016-09-02 ENCOUNTER — Encounter: Payer: Self-pay | Admitting: Internal Medicine

## 2016-09-02 ENCOUNTER — Ambulatory Visit (INDEPENDENT_AMBULATORY_CARE_PROVIDER_SITE_OTHER): Payer: Medicare Other | Admitting: Internal Medicine

## 2016-09-02 DIAGNOSIS — B20 Human immunodeficiency virus [HIV] disease: Secondary | ICD-10-CM

## 2016-09-02 DIAGNOSIS — Z23 Encounter for immunization: Secondary | ICD-10-CM | POA: Diagnosis present

## 2016-09-02 DIAGNOSIS — R634 Abnormal weight loss: Secondary | ICD-10-CM | POA: Insufficient documentation

## 2016-09-02 DIAGNOSIS — N289 Disorder of kidney and ureter, unspecified: Secondary | ICD-10-CM

## 2016-09-02 NOTE — Assessment & Plan Note (Signed)
I do not know what is causing her weight loss. She may have some age and HIV related muscle wasting but it is not really apparent on exam. She is exercising more and this may be contributing. I will simply monitor her weight at this time.

## 2016-09-02 NOTE — Assessment & Plan Note (Signed)
Her creatinine has wavered between high normal and low abnormal for many years. This is no change recently and this started before she ever went on Odefsey. I suspect this is related to increasing age and hypertension.

## 2016-09-02 NOTE — Assessment & Plan Note (Signed)
Her infection is in a lasting remission. I do not think Madison Newman is likely to be causing her weight loss or elevated creatinine or dry skin. She is certainly willing to continue it. She will follow-up after lab work in 6 months. She did receive her influenza vaccination today.

## 2016-09-02 NOTE — Progress Notes (Signed)
Patient Active Problem List   Diagnosis Date Noted  . Human immunodeficiency virus (HIV) disease ([place]) 12/14/2006    Priority: High  . Renal insufficiency 09/02/2016  . Unintentional weight loss 09/02/2016  . Face lesion 05/19/2013  . Cataracts, bilateral 08/12/2012  . 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 ANXIETY 12/07/2007  . LUMPECTOMY, BREAST, HX OF 12/16/2006  . TAH/BSO, HX OF 12/16/2006  . HYPERLIPIDEMIA 12/14/2006  . ANEMIA NEC 12/14/2006  . IRITIS 12/14/2006  . HYPERTENSION 12/14/2006  . REFLUX ESOPHAGITIS 12/14/2006  . BREAST CANCER, HX OF 12/14/2006    Patient's Medications  New Prescriptions   No medications on file  Previous Medications   ASCORBIC ACID (VITAMIN C) 100 MG TABLET    Take 100 mg by mouth daily.   CALCIUM CARBONATE 200 MG CAPSULE    Take 250 mg by mouth 2 (two) times daily with a meal.   DIFLUPREDNATE (DUREZOL) 0.05 % EMUL    Apply to eye.   EMTRICITABINE-RILPIVIR-TENOFOVIR AF (ODEFSEY) 200-25-25 MG TABS TABLET    Take 1 tablet by mouth daily.   NEBIVOLOL HCL (BYSTOLIC) 20 MG TABS    Take 1 tablet by mouth daily.     SPIRONOLACTONE (ALDACTONE) 12.5 MG TABS    Take 12.5 mg by mouth daily.     VITAMIN D, ERGOCALCIFEROL, (DRISDOL) 50000 UNITS CAPS    Take 2,000 Units by mouth daily.   Modified Medications   No medications on file  Discontinued Medications   No medications on file    Subjective: Madison Newman is in for her routine follow-up visit. She has had no problems obtaining or taking her Odefsey. She has not missed any doses. She is worried that her chronic dry skin and recent weight loss might be related to [facility]. She feels like she is losing muscle mass and she noted that her creatinine was up slightly. She has been following a new diet and taking amino acid supplements and exercising more. She recently started on amlodipine, a third antihypertensive agent. She happily tells me that she recently scored her third: 1.   Review of Systems: Review of  Systems  Constitutional: Positive for weight loss. Negative for chills, diaphoresis, fever and malaise/fatigue.  HENT: Negative for sore throat.   Respiratory: Negative for cough, sputum production and shortness of breath.   Cardiovascular: Negative for chest pain.  Gastrointestinal: Negative for abdominal pain, diarrhea, heartburn, nausea and vomiting.  Genitourinary: Negative for dysuria.  Musculoskeletal: Negative for joint pain and myalgias.  Skin: Negative for itching and rash.       Dry skin.  Neurological: Negative for dizziness and headaches.  Psychiatric/Behavioral: Negative for depression and substance abuse. The patient is not nervous/anxious.     Past Medical History:  Diagnosis Date  . Hypertension     Social History  Substance Use Topics  . Smoking status: Never Smoker  . Smokeless tobacco: Never Used  . Alcohol use No    No family history on file.  No Known Allergies  Objective:  Vitals:   09/02/16 0844  Weight: 136 lb (61.7 kg)   Body mass index is 22.29 kg/m.  Physical Exam  Constitutional: She is oriented to person, place, and time.  She is in good spirits as usual. She has very fancy, painted fingernails.  HENT:  Mouth/Throat: No oropharyngeal exudate.  Eyes: Conjunctivae are normal.  Cardiovascular: Normal rate and regular rhythm.   No murmur heard. Pulmonary/Chest: Effort normal and breath sounds normal.  Abdominal: Soft. She exhibits no mass. There is no tenderness.  Musculoskeletal: Normal range of motion.  Neurological: She is alert and oriented to person, place, and time.  Skin: No rash noted.  Psychiatric: Mood and affect normal.    Lab Results Lab Results  Component Value Date   WBC 5.7 08/18/2016   HGB 10.8 (L) 08/18/2016   HCT 33.4 (L) 08/18/2016   MCV 91.0 08/18/2016   PLT 337 08/18/2016    Lab Results  Component Value Date   CREATININE 1.06 (H) 08/18/2016   BUN 18 08/18/2016   NA 136 08/18/2016   K 4.3 08/18/2016    CL 101 08/18/2016   CO2 27 08/18/2016    Lab Results  Component Value Date   ALT 12 08/18/2016   AST 23 08/18/2016   ALKPHOS 106 08/18/2016   BILITOT 0.3 08/18/2016    Lab Results  Component Value Date   CHOL 192 02/19/2016   HDL 50 02/19/2016   LDLCALC 127 02/19/2016   TRIG 77 02/19/2016   CHOLHDL 3.8 02/19/2016   HIV 1 RNA Quant (copies/mL)  Date Value  08/18/2016 <20  02/19/2016 <20  07/31/2015 <20   CD4 T Cell Abs (/uL)  Date Value  08/18/2016 380 (L)  02/19/2016 350 (L)  07/31/2015 440     Problem List Items Addressed This Visit      High   Human immunodeficiency virus (HIV) disease ([place])    Her infection is in a lasting remission. I do not think Madison Newman is likely to be causing her weight loss or elevated creatinine or dry skin. She is certainly willing to continue it. She will follow-up after lab work in 6 months. She did receive her influenza vaccination today.      Relevant Orders   T-helper cell (CD4)- ([facility] clinic only)   HIV 1 RNA quant-no reflex-bld   CBC   Comprehensive metabolic panel   RPR     Unprioritized   Renal insufficiency    Her creatinine has wavered between high normal and low abnormal for many years. This is no change recently and this started before she ever went on Odefsey. I suspect this is related to increasing age and hypertension.      Unintentional weight loss    I do not know what is causing her weight loss. She may have some age and HIV related muscle wasting but it is not really apparent on exam. She is exercising more and this may be contributing. I will simply monitor her weight at this time.           Michel Bickers, MD [facility] for [place] [facility] [phone] pager   [phone] cell 09/02/2016, 9:02 AM

## 2016-09-23 DIAGNOSIS — E782 Mixed hyperlipidemia: Secondary | ICD-10-CM | POA: Diagnosis not present

## 2016-09-23 DIAGNOSIS — I10 Essential (primary) hypertension: Secondary | ICD-10-CM | POA: Diagnosis not present

## 2016-09-23 DIAGNOSIS — R7309 Other abnormal glucose: Secondary | ICD-10-CM | POA: Diagnosis not present

## 2016-09-23 DIAGNOSIS — B20 Human immunodeficiency virus [HIV] disease: Secondary | ICD-10-CM | POA: Diagnosis not present

## 2017-01-28 ENCOUNTER — Encounter: Payer: Self-pay | Admitting: Internal Medicine

## 2017-02-03 ENCOUNTER — Ambulatory Visit: Payer: Medicare Other

## 2017-02-10 ENCOUNTER — Encounter: Payer: Self-pay | Admitting: Internal Medicine

## 2017-02-19 ENCOUNTER — Other Ambulatory Visit: Payer: Medicare Other

## 2017-02-19 DIAGNOSIS — B20 Human immunodeficiency virus [HIV] disease: Secondary | ICD-10-CM | POA: Diagnosis not present

## 2017-02-19 DIAGNOSIS — Z79899 Other long term (current) drug therapy: Secondary | ICD-10-CM

## 2017-02-19 LAB — LIPID PANEL
Cholesterol: 209 mg/dL — ABNORMAL HIGH (ref <200)
HDL: 68 mg/dL (ref >50)
LDL Cholesterol: 126 mg/dL — ABNORMAL HIGH (ref <100)
Total CHOL/HDL Ratio: 3.1 Ratio (ref <5.0)
Triglycerides: 77 mg/dL (ref <150)
VLDL: 15 mg/dL (ref <30)

## 2017-02-19 LAB — CBC
HCT: 34.1 % — ABNORMAL LOW (ref 35.0–45.0)
HEMOGLOBIN: 10.9 g/dL — AB (ref 11.7–15.5)
MCH: 29.2 pg (ref 27.0–33.0)
MCHC: 32 g/dL (ref 32.0–36.0)
MCV: 91.4 fL (ref 80.0–100.0)
MPV: 9.2 fL (ref 7.5–12.5)
Platelets: 301 10*3/uL (ref 140–400)
RBC: 3.73 MIL/uL — AB (ref 3.80–5.10)
RDW: 14.4 % (ref 11.0–15.0)
WBC: 4.3 10*3/uL (ref 3.8–10.8)

## 2017-02-19 LAB — COMPREHENSIVE METABOLIC PANEL
ALBUMIN: 4.3 g/dL (ref 3.6–5.1)
ALK PHOS: 94 U/L (ref 33–130)
ALT: 11 U/L (ref 6–29)
AST: 22 U/L (ref 10–35)
BILIRUBIN TOTAL: 0.4 mg/dL (ref 0.2–1.2)
BUN: 23 mg/dL (ref 7–25)
CHLORIDE: 101 mmol/L (ref 98–110)
CO2: 21 mmol/L (ref 20–31)
CREATININE: 1.18 mg/dL — AB (ref 0.60–0.93)
Calcium: 10.7 mg/dL — ABNORMAL HIGH (ref 8.6–10.4)
Glucose, Bld: 76 mg/dL (ref 65–99)
POTASSIUM: 5 mmol/L (ref 3.5–5.3)
SODIUM: 137 mmol/L (ref 135–146)
TOTAL PROTEIN: 7.7 g/dL (ref 6.1–8.1)

## 2017-02-20 DIAGNOSIS — R7309 Other abnormal glucose: Secondary | ICD-10-CM | POA: Diagnosis not present

## 2017-02-20 DIAGNOSIS — B20 Human immunodeficiency virus [HIV] disease: Secondary | ICD-10-CM | POA: Diagnosis not present

## 2017-02-20 DIAGNOSIS — E782 Mixed hyperlipidemia: Secondary | ICD-10-CM | POA: Diagnosis not present

## 2017-02-20 DIAGNOSIS — I10 Essential (primary) hypertension: Secondary | ICD-10-CM | POA: Diagnosis not present

## 2017-02-20 LAB — T-HELPER CELL (CD4) - (RCID CLINIC ONLY)
CD4 T CELL ABS: 570 /uL (ref 400–2700)
CD4 T CELL HELPER: 28 % — AB (ref 33–55)

## 2017-02-20 LAB — RPR

## 2017-02-23 LAB — HIV-1 RNA QUANT-NO REFLEX-BLD
HIV 1 RNA Quant: <20 copies/mL
HIV-1 RNA QUANT, LOG: NOT DETECTED {Log_copies}/mL

## 2017-02-26 ENCOUNTER — Encounter: Payer: Self-pay | Admitting: Internal Medicine

## 2017-03-02 ENCOUNTER — Other Ambulatory Visit: Payer: Self-pay | Admitting: Internal Medicine

## 2017-03-02 DIAGNOSIS — B20 Human immunodeficiency virus [HIV] disease: Secondary | ICD-10-CM

## 2017-03-03 ENCOUNTER — Ambulatory Visit (INDEPENDENT_AMBULATORY_CARE_PROVIDER_SITE_OTHER): Payer: Medicare Other | Admitting: Internal Medicine

## 2017-03-03 ENCOUNTER — Encounter: Payer: Self-pay | Admitting: Internal Medicine

## 2017-03-03 DIAGNOSIS — B20 Human immunodeficiency virus [HIV] disease: Secondary | ICD-10-CM

## 2017-03-03 NOTE — Assessment & Plan Note (Signed)
Her infection remains under excellent, long-term control. She will continue Odefsey in follow-up after lab work in 6 months. 

## 2017-03-03 NOTE — Progress Notes (Signed)
Patient Active Problem List   Diagnosis Date Noted  . Human immunodeficiency virus (HIV) disease ([place]) 12/14/2006    Priority: High  . Renal insufficiency 09/02/2016  . Unintentional weight loss 09/02/2016  . Face lesion 05/19/2013  . Cataracts, bilateral 08/12/2012  . 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 ANXIETY 12/07/2007  . LUMPECTOMY, BREAST, HX OF 12/16/2006  . TAH/BSO, HX OF 12/16/2006  . HYPERLIPIDEMIA 12/14/2006  . ANEMIA NEC 12/14/2006  . IRITIS 12/14/2006  . HYPERTENSION 12/14/2006  . REFLUX ESOPHAGITIS 12/14/2006  . BREAST CANCER, HX OF 12/14/2006    Patient's Medications  New Prescriptions   No medications on file  Previous Medications   AMLODIPINE (NORVASC) 5 MG TABLET    Take 5 mg by mouth daily.   ASCORBIC ACID (VITAMIN C) 100 MG TABLET    Take 100 mg by mouth daily.   CALCIUM CARBONATE 200 MG CAPSULE    Take 250 mg by mouth 2 (two) times daily with a meal.   DIFLUPREDNATE (DUREZOL) 0.05 % EMUL    Apply to eye.   NEBIVOLOL HCL (BYSTOLIC) 20 MG TABS    Take 1 tablet by mouth daily.     ODEFSEY 200-25-25 MG TABS TABLET    TAKE 1 TABLET BY MOUTH DAILY   SPIRONOLACTONE (ALDACTONE) 12.5 MG TABS    Take 12.5 mg by mouth daily.     VITAMIN D, ERGOCALCIFEROL, (DRISDOL) 50000 UNITS CAPS    Take 2,000 Units by mouth daily.   Modified Medications   No medications on file  Discontinued Medications   No medications on file    Subjective: Madison Newman is in for her routine HIV follow-up visit. She's had no problems obtaining, taking or tolerating her Odefsey. As usual, she does not recall missing any doses. She is feeling well.   Review of Systems: Review of Systems  Constitutional: Negative for chills, diaphoresis, fever and weight loss.  Gastrointestinal: Negative for abdominal pain, diarrhea, nausea and vomiting.    Past Medical History:  Diagnosis Date  . Hypertension     Social History  Substance Use Topics  . Smoking status: Never Smoker  . Smokeless tobacco:  Never Used  . Alcohol use No    No family history on file.  No Known Allergies  Objective:  Vitals:   03/03/17 1006  BP: (!) 195/69  Pulse: (!) 58  Temp: 97.6 F (36.4 C)  TempSrc: Oral  Weight: 137 lb (62.1 kg)  Height: 5\' 5"  (1.651 m)   Body mass index is 22.8 kg/m.  Physical Exam  Constitutional: She is oriented to person, place, and time.  She is in good spirits. Her weight is unchanged.  HENT:  Mouth/Throat: No oropharyngeal exudate.  Eyes: Conjunctivae are normal.  Cardiovascular: Normal rate and regular rhythm.   No murmur heard. Pulmonary/Chest: Effort normal and breath sounds normal.  Abdominal: Soft. She exhibits no mass. There is no tenderness.  Musculoskeletal: Normal range of motion.  Neurological: She is alert and oriented to person, place, and time.  Skin: No rash noted.  Psychiatric: Mood and affect normal.    Lab Results Lab Results  Component Value Date   WBC 4.3 02/19/2017   HGB 10.9 (L) 02/19/2017   HCT 34.1 (L) 02/19/2017   MCV 91.4 02/19/2017   PLT 301 02/19/2017    Lab Results  Component Value Date   CREATININE 1.18 (H) 02/19/2017   BUN 23 02/19/2017   NA 137 02/19/2017   K 5.0  02/19/2017   CL 101 02/19/2017   CO2 21 02/19/2017    Lab Results  Component Value Date   ALT 11 02/19/2017   AST 22 02/19/2017   ALKPHOS 94 02/19/2017   BILITOT 0.4 02/19/2017    Lab Results  Component Value Date   CHOL 209 (H) 02/19/2017   HDL 68 02/19/2017   LDLCALC 126 (H) 02/19/2017   TRIG 77 02/19/2017   CHOLHDL 3.1 02/19/2017   HIV 1 RNA Quant (copies/mL)  Date Value  02/19/2017 <20 NOT DETECTED  08/18/2016 <20  02/19/2016 <20   CD4 T Cell Abs (/uL)  Date Value  02/19/2017 570  08/18/2016 380 (L)  02/19/2016 350 (L)     Problem List Items Addressed This Visit      High   Human immunodeficiency virus (HIV) disease ([place])    Her infection remains under excellent, long-term control. She will continue Odefsey in follow-up after  lab work in 6 months.      Relevant Orders   T-helper cell (CD4)- ([facility] clinic only)   HIV 1 RNA quant-no reflex-bld   CBC   Comprehensive metabolic panel        Michel Bickers, MD [facility] for [place] [phone] pager   [phone] cell 03/03/2017, 10:22 AM

## 2017-03-05 ENCOUNTER — Ambulatory Visit: Payer: Self-pay | Admitting: Internal Medicine

## 2017-06-15 ENCOUNTER — Ambulatory Visit: Payer: Self-pay

## 2017-06-18 ENCOUNTER — Encounter: Payer: Self-pay | Admitting: Internal Medicine

## 2017-07-24 DIAGNOSIS — I10 Essential (primary) hypertension: Secondary | ICD-10-CM | POA: Diagnosis not present

## 2017-07-24 DIAGNOSIS — E782 Mixed hyperlipidemia: Secondary | ICD-10-CM | POA: Diagnosis not present

## 2017-07-24 DIAGNOSIS — R7309 Other abnormal glucose: Secondary | ICD-10-CM | POA: Diagnosis not present

## 2017-07-24 DIAGNOSIS — Z6823 Body mass index (BMI) 23.0-23.9, adult: Secondary | ICD-10-CM | POA: Diagnosis not present

## 2017-07-24 DIAGNOSIS — B20 Human immunodeficiency virus [HIV] disease: Secondary | ICD-10-CM | POA: Diagnosis not present

## 2017-07-30 ENCOUNTER — Other Ambulatory Visit: Payer: Self-pay | Admitting: Internal Medicine

## 2017-07-30 DIAGNOSIS — B20 Human immunodeficiency virus [HIV] disease: Secondary | ICD-10-CM

## 2017-08-25 ENCOUNTER — Other Ambulatory Visit: Payer: Medicare Other

## 2017-08-25 DIAGNOSIS — B20 Human immunodeficiency virus [HIV] disease: Secondary | ICD-10-CM

## 2017-08-25 LAB — COMPREHENSIVE METABOLIC PANEL
AG RATIO: 1.2 (calc) (ref 1.0–2.5)
ALBUMIN MSPROF: 4.3 g/dL (ref 3.6–5.1)
ALT: 10 U/L (ref 6–29)
AST: 18 U/L (ref 10–35)
Alkaline phosphatase (APISO): 102 U/L (ref 33–130)
BILIRUBIN TOTAL: 0.4 mg/dL (ref 0.2–1.2)
BUN / CREAT RATIO: 19 (calc) (ref 6–22)
BUN: 21 mg/dL (ref 7–25)
CALCIUM: 10 mg/dL (ref 8.6–10.4)
CHLORIDE: 101 mmol/L (ref 98–110)
CO2: 26 mmol/L (ref 20–32)
Creat: 1.1 mg/dL — ABNORMAL HIGH (ref 0.60–0.93)
GLOBULIN: 3.7 g/dL (ref 1.9–3.7)
GLUCOSE: 82 mg/dL (ref 65–99)
POTASSIUM: 4.4 mmol/L (ref 3.5–5.3)
SODIUM: 137 mmol/L (ref 135–146)
TOTAL PROTEIN: 8 g/dL (ref 6.1–8.1)

## 2017-08-25 LAB — CBC
HEMATOCRIT: 33.4 % — AB (ref 35.0–45.0)
HEMOGLOBIN: 11.2 g/dL — AB (ref 11.7–15.5)
MCH: 29.9 pg (ref 27.0–33.0)
MCHC: 33.5 g/dL (ref 32.0–36.0)
MCV: 89.1 fL (ref 80.0–100.0)
MPV: 9.8 fL (ref 7.5–12.5)
Platelets: 292 10*3/uL (ref 140–400)
RBC: 3.75 10*6/uL — AB (ref 3.80–5.10)
RDW: 13 % (ref 11.0–15.0)
WBC: 5.3 10*3/uL (ref 3.8–10.8)

## 2017-08-26 LAB — T-HELPER CELL (CD4) - (RCID CLINIC ONLY)
CD4 T CELL ABS: 440 /uL (ref 400–2700)
CD4 T CELL HELPER: 27 % — AB (ref 33–55)

## 2017-08-27 LAB — HIV-1 RNA QUANT-NO REFLEX-BLD
HIV 1 RNA QUANT: 62 {copies}/mL — AB
HIV-1 RNA QUANT, LOG: 1.79 {Log_copies}/mL — AB

## 2017-09-08 ENCOUNTER — Ambulatory Visit: Payer: Self-pay | Admitting: Internal Medicine

## 2017-09-15 ENCOUNTER — Encounter: Payer: Self-pay | Admitting: Internal Medicine

## 2017-09-15 ENCOUNTER — Ambulatory Visit (INDEPENDENT_AMBULATORY_CARE_PROVIDER_SITE_OTHER): Payer: Medicare Other | Admitting: Internal Medicine

## 2017-09-15 DIAGNOSIS — B20 Human immunodeficiency virus [HIV] disease: Secondary | ICD-10-CM | POA: Diagnosis present

## 2017-09-15 DIAGNOSIS — Z23 Encounter for immunization: Secondary | ICD-10-CM

## 2017-09-15 NOTE — Assessment & Plan Note (Signed)
Although she has some very low-level viral activation I reassured her that overall, her infection has been under excellent long-term control. She will continue Odefsey in follow-up after lab work in 6 months. She received her influenza vaccination today.

## 2017-09-15 NOTE — Progress Notes (Signed)
Patient Active Problem List   Diagnosis Date Noted  . Human immunodeficiency virus (HIV) disease ([place]) 12/14/2006    Priority: High  . Renal insufficiency 09/02/2016  . Unintentional weight loss 09/02/2016  . Face lesion 05/19/2013  . Cataracts, bilateral 08/12/2012  . 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 ANXIETY 12/07/2007  . LUMPECTOMY, BREAST, HX OF 12/16/2006  . TAH/BSO, HX OF 12/16/2006  . HYPERLIPIDEMIA 12/14/2006  . ANEMIA NEC 12/14/2006  . IRITIS 12/14/2006  . HYPERTENSION 12/14/2006  . REFLUX ESOPHAGITIS 12/14/2006  . BREAST CANCER, HX OF 12/14/2006      Medication List        Accurate as of 09/15/17  4:13 PM. Always use your most recent med list.          amLODipine 5 MG tablet Commonly known as:  NORVASC   BYSTOLIC 20 MG Tabs Generic drug:  Nebivolol HCl   calcium carbonate 200 MG capsule   DUREZOL 0.05 % Emul Generic drug:  Difluprednate   ODEFSEY 200-25-25 MG Tabs tablet Generic drug:  emtricitabine-rilpivir-tenofovir AF TAKE 1 TABLET BY MOUTH DAILY   spironolactone 12.5 mg Tabs tablet Commonly known as:  ALDACTONE   vitamin C 100 MG tablet   Vitamin D (Ergocalciferol) 50000 units Caps capsule Commonly known as:  DRISDOL       Subjective: [place] is in for her routine HIV follow-up visit. She has had no problems obtaining, taking or tolerating her Odefsey. As usual, she never misses a dose. She says that she has some dry mouth that she associates with [facility] but otherwise tolerates it well. She saw her test results and is a little concerned that her viral load went up to 62. She is not sexually active.   Review of Systems: Review of Systems  Constitutional: Negative for chills, diaphoresis, fever, malaise/fatigue and weight loss.  HENT:       Dry mouth as noted in history of present illness.    Past Medical History:  Diagnosis Date  . Hypertension     Social History   Tobacco Use  . Smoking status: Never Smoker  . Smokeless  tobacco: Never Used  Substance Use Topics  . Alcohol use: No    Alcohol/week: 0.0 oz  . Drug use: No    No family history on file.  No Known Allergies  Health Maintenance  Topic Date Due  . MAMMOGRAM  09/08/1993  . COLONOSCOPY  09/08/1993  . DEXA SCAN  09/08/2008  . PNA vac Low Risk Adult (2 of 2 - PCV13) 06/14/2014  . INFLUENZA VACCINE  05/27/2017  . TETANUS/TDAP  07/20/2024    Objective:  Vitals:   09/15/17 1549  BP: (!) 197/77  Pulse: 69  Temp: 98.3 F (36.8 C)  TempSrc: Oral  Weight: 142 lb (64.4 kg)  Height: 5\' 6"  (1.676 m)   Body mass index is 22.92 kg/m.  Physical Exam  Constitutional: She is oriented to person, place, and time.  She is in her usual good spirits.  HENT:  Mouth/Throat: No oropharyngeal exudate.  Moist mucous membranes and tongue.  Cardiovascular: Normal rate and regular rhythm.  No murmur heard. Pulmonary/Chest: Effort normal and breath sounds normal. She has no wheezes. She has no rales.  Abdominal: Soft. There is no tenderness.  Neurological: She is alert and oriented to person, place, and time.  Skin: No rash noted.  Psychiatric: Mood and affect normal.    Lab Results Lab Results  Component Value Date  WBC 5.3 08/25/2017   HGB 11.2 (L) 08/25/2017   HCT 33.4 (L) 08/25/2017   MCV 89.1 08/25/2017   PLT 292 08/25/2017    Lab Results  Component Value Date   CREATININE 1.10 (H) 08/25/2017   BUN 21 08/25/2017   NA 137 08/25/2017   K 4.4 08/25/2017   CL 101 08/25/2017   CO2 26 08/25/2017    Lab Results  Component Value Date   ALT 10 08/25/2017   AST 18 08/25/2017   ALKPHOS 94 02/19/2017   BILITOT 0.4 08/25/2017    Lab Results  Component Value Date   CHOL 209 (H) 02/19/2017   HDL 68 02/19/2017   LDLCALC 126 (H) 02/19/2017   TRIG 77 02/19/2017   CHOLHDL 3.1 02/19/2017   Lab Results  Component Value Date   LABRPR NON REAC 02/19/2017   HIV 1 RNA Quant (copies/mL)  Date Value  08/25/2017 62 (H)  02/19/2017 <20  NOT DETECTED  08/18/2016 <20   CD4 T Cell Abs (/uL)  Date Value  08/25/2017 440  02/19/2017 570  08/18/2016 380 (L)     Problem List Items Addressed This Visit      High   Human immunodeficiency virus (HIV) disease ([place])    Although she has some very low-level viral activation I reassured her that overall, her infection has been under excellent long-term control. She will continue Odefsey in follow-up after lab work in 6 months. She received her influenza vaccination today.      Relevant Orders   T-helper cell (CD4)- ([facility] clinic only)   HIV 1 RNA quant-no reflex-bld   CBC   Comprehensive metabolic panel   RPR        Michel Bickers, MD [facility] for [place] [phone] pager   [phone] cell 09/15/2017, 4:13 PM

## 2017-09-16 ENCOUNTER — Encounter: Payer: Self-pay | Admitting: Internal Medicine

## 2017-10-22 DIAGNOSIS — H04123 Dry eye syndrome of bilateral lacrimal glands: Secondary | ICD-10-CM | POA: Diagnosis not present

## 2017-10-22 DIAGNOSIS — Z961 Presence of intraocular lens: Secondary | ICD-10-CM | POA: Diagnosis not present

## 2018-01-27 ENCOUNTER — Other Ambulatory Visit: Payer: Self-pay | Admitting: Internal Medicine

## 2018-01-27 DIAGNOSIS — B20 Human immunodeficiency virus [HIV] disease: Secondary | ICD-10-CM

## 2018-02-23 ENCOUNTER — Ambulatory Visit: Payer: Medicare Other

## 2018-02-24 ENCOUNTER — Encounter: Payer: Self-pay | Admitting: Internal Medicine

## 2018-03-04 ENCOUNTER — Other Ambulatory Visit: Payer: Medicare Other

## 2018-03-04 DIAGNOSIS — B20 Human immunodeficiency virus [HIV] disease: Secondary | ICD-10-CM | POA: Diagnosis not present

## 2018-03-05 DIAGNOSIS — B20 Human immunodeficiency virus [HIV] disease: Secondary | ICD-10-CM | POA: Diagnosis not present

## 2018-03-05 DIAGNOSIS — I10 Essential (primary) hypertension: Secondary | ICD-10-CM | POA: Diagnosis not present

## 2018-03-05 LAB — RPR: RPR Ser Ql: NONREACTIVE

## 2018-03-05 LAB — COMPREHENSIVE METABOLIC PANEL
AG Ratio: 1.2 (calc) (ref 1.0–2.5)
ALBUMIN MSPROF: 4.2 g/dL (ref 3.6–5.1)
ALKALINE PHOSPHATASE (APISO): 93 U/L (ref 33–130)
ALT: 11 U/L (ref 6–29)
AST: 22 U/L (ref 10–35)
BUN / CREAT RATIO: 18 (calc) (ref 6–22)
BUN: 19 mg/dL (ref 7–25)
CO2: 27 mmol/L (ref 20–32)
Calcium: 10.1 mg/dL (ref 8.6–10.4)
Chloride: 103 mmol/L (ref 98–110)
Creat: 1.08 mg/dL — ABNORMAL HIGH (ref 0.60–0.93)
GLOBULIN: 3.5 g/dL (ref 1.9–3.7)
GLUCOSE: 93 mg/dL (ref 65–99)
POTASSIUM: 4.3 mmol/L (ref 3.5–5.3)
Sodium: 136 mmol/L (ref 135–146)
Total Bilirubin: 0.5 mg/dL (ref 0.2–1.2)
Total Protein: 7.7 g/dL (ref 6.1–8.1)

## 2018-03-05 LAB — CBC
HEMATOCRIT: 33.7 % — AB (ref 35.0–45.0)
HEMOGLOBIN: 11.2 g/dL — AB (ref 11.7–15.5)
MCH: 29.7 pg (ref 27.0–33.0)
MCHC: 33.2 g/dL (ref 32.0–36.0)
MCV: 89.4 fL (ref 80.0–100.0)
MPV: 10.1 fL (ref 7.5–12.5)
Platelets: 291 10*3/uL (ref 140–400)
RBC: 3.77 10*6/uL — ABNORMAL LOW (ref 3.80–5.10)
RDW: 13 % (ref 11.0–15.0)
WBC: 5 10*3/uL (ref 3.8–10.8)

## 2018-03-05 LAB — T-HELPER CELL (CD4) - (RCID CLINIC ONLY)
CD4 T CELL ABS: 440 /uL (ref 400–2700)
CD4 T CELL HELPER: 28 % — AB (ref 33–55)

## 2018-03-07 LAB — HIV-1 RNA QUANT-NO REFLEX-BLD
HIV 1 RNA Quant: <20 copies/mL — AB
HIV-1 RNA QUANT, LOG: DETECTED {Log_copies}/mL — AB

## 2018-03-16 ENCOUNTER — Encounter: Payer: Self-pay | Admitting: Internal Medicine

## 2018-03-16 ENCOUNTER — Ambulatory Visit (INDEPENDENT_AMBULATORY_CARE_PROVIDER_SITE_OTHER): Payer: Medicare Other | Admitting: Internal Medicine

## 2018-03-16 DIAGNOSIS — B20 Human immunodeficiency virus [HIV] disease: Secondary | ICD-10-CM | POA: Diagnosis not present

## 2018-03-16 NOTE — Assessment & Plan Note (Signed)
Her infection remains under very good, long-term control.  She will continue [facility] and follow-up after lab work in 6 months.

## 2018-03-16 NOTE — Progress Notes (Signed)
Patient Active Problem List   Diagnosis Date Noted  . Human immunodeficiency virus (HIV) disease ([place]) 12/14/2006    Priority: High  . Renal insufficiency 09/02/2016  . Unintentional weight loss 09/02/2016  . Face lesion 05/19/2013  . Cataracts, bilateral 08/12/2012  . 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 ANXIETY 12/07/2007  . LUMPECTOMY, BREAST, HX OF 12/16/2006  . TAH/BSO, HX OF 12/16/2006  . HYPERLIPIDEMIA 12/14/2006  . ANEMIA NEC 12/14/2006  . IRITIS 12/14/2006  . HYPERTENSION 12/14/2006  . REFLUX ESOPHAGITIS 12/14/2006  . BREAST CANCER, HX OF 12/14/2006    Patient's Medications  New Prescriptions   No medications on file  Previous Medications   AMLODIPINE (NORVASC) 5 MG TABLET    Take 5 mg by mouth daily.   ASCORBIC ACID (VITAMIN C) 100 MG TABLET    Take 100 mg by mouth daily.   CALCIUM CARBONATE 200 MG CAPSULE    Take 250 mg by mouth 2 (two) times daily with a meal.   DIFLUPREDNATE (DUREZOL) 0.05 % EMUL    Apply to eye.   NEBIVOLOL HCL (BYSTOLIC) 20 MG TABS    Take 1 tablet by mouth daily.     ODEFSEY 200-25-25 MG TABS TABLET    TAKE 1 TABLET BY MOUTH DAILY   SPIRONOLACTONE (ALDACTONE) 12.5 MG TABS    Take 12.5 mg by mouth daily.     VITAMIN D, ERGOCALCIFEROL, (DRISDOL) 50000 UNITS CAPS    Take 2,000 Units by mouth daily.   Modified Medications   No medications on file  Discontinued Medications   No medications on file    Subjective: Madison Newman is in for her routine HIV follow-up visit.  She has had no problems obtaining, taking or tolerating her Odefsey and never misses doses.  She is feeling well.  She is going to be going to [place] to play golf at [employer] with 300 other African-American women from across the country.  Review of Systems: Review of Systems  Constitutional: Negative for chills, diaphoresis, fever, malaise/fatigue and weight loss.  Gastrointestinal: Negative for abdominal pain, diarrhea, nausea and vomiting.  Neurological: Negative for  headaches.  Psychiatric/Behavioral: Negative for depression.    Past Medical History:  Diagnosis Date  . Hypertension     Social History   Tobacco Use  . Smoking status: Never Smoker  . Smokeless tobacco: Never Used  Substance Use Topics  . Alcohol use: No    Alcohol/week: 0.0 oz  . Drug use: No    No family history on file.  No Known Allergies  Health Maintenance  Topic Date Due  . MAMMOGRAM  09/08/1993  . COLONOSCOPY  09/08/1993  . DEXA SCAN  09/08/2008  . PNA vac Low Risk Adult (2 of 2 - PCV13) 06/14/2014  . INFLUENZA VACCINE  05/27/2018  . TETANUS/TDAP  07/20/2024    Objective:  Vitals:   03/16/18 1038  BP: (!) 189/64  Pulse: 62  Temp: 98.4 F (36.9 C)  TempSrc: Oral  Weight: 144 lb (65.3 kg)  Height: 5\' 6"  (1.676 m)   Body mass index is 23.24 kg/m.  Physical Exam  Constitutional: She is oriented to person, place, and time.  She is in good spirits as usual.  HENT:  Mouth/Throat: No oropharyngeal exudate.  Pulmonary/Chest: Effort normal and breath sounds normal.  Neurological: She is alert and oriented to person, place, and time.  Skin: No rash noted.  Psychiatric: She has a normal mood and affect.    Lab  Results Lab Results  Component Value Date   WBC 5.0 03/04/2018   HGB 11.2 (L) 03/04/2018   HCT 33.7 (L) 03/04/2018   MCV 89.4 03/04/2018   PLT 291 03/04/2018    Lab Results  Component Value Date   CREATININE 1.08 (H) 03/04/2018   BUN 19 03/04/2018   NA 136 03/04/2018   K 4.3 03/04/2018   CL 103 03/04/2018   CO2 27 03/04/2018    Lab Results  Component Value Date   ALT 11 03/04/2018   AST 22 03/04/2018   ALKPHOS 94 02/19/2017   BILITOT 0.5 03/04/2018    Lab Results  Component Value Date   CHOL 209 (H) 02/19/2017   HDL 68 02/19/2017   LDLCALC 126 (H) 02/19/2017   TRIG 77 02/19/2017   CHOLHDL 3.1 02/19/2017   Lab Results  Component Value Date   LABRPR NON-REACTIVE 03/04/2018   HIV 1 RNA Quant (copies/mL)  Date Value    03/04/2018 <20 DETECTED (A)  08/25/2017 62 (H)  02/19/2017 <20 NOT DETECTED   CD4 T Cell Abs (/uL)  Date Value  03/04/2018 440  08/25/2017 440  02/19/2017 570     Problem List Items Addressed This Visit      High   Human immunodeficiency virus (HIV) disease ([place])    Her infection remains under very good, long-term control.  She will continue [facility] and follow-up after lab work in 6 months.      Relevant Orders   T-helper cell (CD4)- ([facility] clinic only)   HIV 1 RNA quant-no reflex-bld   CBC   Comprehensive metabolic panel   RPR        Michel Bickers, MD [facility] for [place] [phone] pager   [phone] cell 03/16/2018, 11:03 AM

## 2018-03-18 ENCOUNTER — Ambulatory Visit: Payer: Self-pay | Admitting: Internal Medicine

## 2018-03-26 ENCOUNTER — Other Ambulatory Visit: Payer: Self-pay | Admitting: Internal Medicine

## 2018-03-26 DIAGNOSIS — B20 Human immunodeficiency virus [HIV] disease: Secondary | ICD-10-CM

## 2018-07-02 ENCOUNTER — Ambulatory Visit: Payer: Medicare Other

## 2018-07-05 ENCOUNTER — Encounter: Payer: Self-pay | Admitting: Internal Medicine

## 2018-07-08 ENCOUNTER — Telehealth: Payer: Self-pay

## 2018-07-08 NOTE — Telephone Encounter (Signed)
Patient is on the overdue pap list. Attempted to call patient to find out when her last pap smear was. Unable to leave a message at this time. [place]

## 2018-09-02 ENCOUNTER — Other Ambulatory Visit: Payer: Medicare Other

## 2018-09-02 DIAGNOSIS — B20 Human immunodeficiency virus [HIV] disease: Secondary | ICD-10-CM

## 2018-09-03 DIAGNOSIS — B20 Human immunodeficiency virus [HIV] disease: Secondary | ICD-10-CM | POA: Diagnosis not present

## 2018-09-03 DIAGNOSIS — I10 Essential (primary) hypertension: Secondary | ICD-10-CM | POA: Diagnosis not present

## 2018-09-03 DIAGNOSIS — Z6824 Body mass index (BMI) 24.0-24.9, adult: Secondary | ICD-10-CM | POA: Diagnosis not present

## 2018-09-03 LAB — T-HELPER CELL (CD4) - (RCID CLINIC ONLY)
CD4 % Helper T Cell: 24 % — ABNORMAL LOW (ref 33–55)
CD4 T Cell Abs: 420 /uL (ref 400–2700)

## 2018-09-06 LAB — COMPREHENSIVE METABOLIC PANEL
AG RATIO: 1.2 (calc) (ref 1.0–2.5)
ALBUMIN MSPROF: 4.3 g/dL (ref 3.6–5.1)
ALT: 17 U/L (ref 6–29)
AST: 27 U/L (ref 10–35)
Alkaline phosphatase (APISO): 101 U/L (ref 33–130)
BILIRUBIN TOTAL: 0.5 mg/dL (ref 0.2–1.2)
BUN/Creatinine Ratio: 17 (calc) (ref 6–22)
BUN: 18 mg/dL (ref 7–25)
CALCIUM: 10 mg/dL (ref 8.6–10.4)
CHLORIDE: 101 mmol/L (ref 98–110)
CO2: 26 mmol/L (ref 20–32)
Creat: 1.06 mg/dL — ABNORMAL HIGH (ref 0.60–0.93)
Globulin: 3.5 g/dL (calc) (ref 1.9–3.7)
Glucose, Bld: 84 mg/dL (ref 65–99)
POTASSIUM: 4.4 mmol/L (ref 3.5–5.3)
SODIUM: 136 mmol/L (ref 135–146)
TOTAL PROTEIN: 7.8 g/dL (ref 6.1–8.1)

## 2018-09-06 LAB — CBC
HCT: 35.4 % (ref 35.0–45.0)
Hemoglobin: 11.4 g/dL — ABNORMAL LOW (ref 11.7–15.5)
MCH: 28.9 pg (ref 27.0–33.0)
MCHC: 32.2 g/dL (ref 32.0–36.0)
MCV: 89.8 fL (ref 80.0–100.0)
MPV: 10 fL (ref 7.5–12.5)
PLATELETS: 300 10*3/uL (ref 140–400)
RBC: 3.94 10*6/uL (ref 3.80–5.10)
RDW: 12.8 % (ref 11.0–15.0)
WBC: 4.8 10*3/uL (ref 3.8–10.8)

## 2018-09-06 LAB — HIV-1 RNA QUANT-NO REFLEX-BLD
HIV 1 RNA QUANT: NOT DETECTED {copies}/mL
HIV-1 RNA QUANT, LOG: NOT DETECTED {Log_copies}/mL

## 2018-09-06 LAB — RPR: RPR: NONREACTIVE

## 2018-09-16 ENCOUNTER — Ambulatory Visit (INDEPENDENT_AMBULATORY_CARE_PROVIDER_SITE_OTHER): Payer: Medicare Other | Admitting: Internal Medicine

## 2018-09-16 ENCOUNTER — Encounter: Payer: Self-pay | Admitting: Internal Medicine

## 2018-09-16 DIAGNOSIS — I10 Essential (primary) hypertension: Secondary | ICD-10-CM

## 2018-09-16 DIAGNOSIS — B20 Human immunodeficiency virus [HIV] disease: Secondary | ICD-10-CM | POA: Diagnosis not present

## 2018-09-16 DIAGNOSIS — Z23 Encounter for immunization: Secondary | ICD-10-CM

## 2018-09-16 NOTE — Progress Notes (Signed)
Patient Active Problem List   Diagnosis Date Noted  . Human immunodeficiency virus (HIV) disease ([place]) 12/14/2006    Priority: High  . Renal insufficiency 09/02/2016  . Unintentional weight loss 09/02/2016  . Face lesion 05/19/2013  . Cataracts, bilateral 08/12/2012  . 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 ANXIETY 12/07/2007  . LUMPECTOMY, BREAST, HX OF 12/16/2006  . TAH/BSO, HX OF 12/16/2006  . HYPERLIPIDEMIA 12/14/2006  . ANEMIA NEC 12/14/2006  . IRITIS 12/14/2006  . Essential hypertension 12/14/2006  . REFLUX ESOPHAGITIS 12/14/2006  . BREAST CANCER, HX OF 12/14/2006    Patient's Medications  New Prescriptions   No medications on file  Previous Medications   AMLODIPINE (NORVASC) 5 MG TABLET    Take 5 mg by mouth daily.   ASCORBIC ACID (VITAMIN C) 100 MG TABLET    Take 100 mg by mouth daily.   CALCIUM CARBONATE 200 MG CAPSULE    Take 250 mg by mouth 2 (two) times daily with a meal.   DIFLUPREDNATE (DUREZOL) 0.05 % EMUL    Apply to eye.   NEBIVOLOL HCL (BYSTOLIC) 20 MG TABS    Take 1 tablet by mouth daily.     ODEFSEY 200-25-25 MG TABS TABLET    TAKE 1 TABLET BY MOUTH DAILY   SPIRONOLACTONE (ALDACTONE) 12.5 MG TABS    Take 12.5 mg by mouth daily.     VITAMIN D, ERGOCALCIFEROL, (DRISDOL) 50000 UNITS CAPS    Take 2,000 Units by mouth daily.   Modified Medications   No medications on file  Discontinued Medications   No medications on file    Subjective: Madison Newman is in for her routine HIV follow-up visit.  She has had no problems obtaining, taking or tolerating her Odefsey.  As usual, she never misses a single dose.  She is feeling well and has no current concerns about her health.  Review of Systems: Review of Systems  Constitutional: Negative for chills, diaphoresis, fever and weight loss.    Past Medical History:  Diagnosis Date  . Hypertension     Social History   Tobacco Use  . Smoking status: Never Smoker  . Smokeless tobacco: Never Used  Substance Use Topics    . Alcohol use: No    Alcohol/week: 0.0 standard drinks  . Drug use: No    No family history on file.  No Known Allergies  Health Maintenance  Topic Date Due  . COLONOSCOPY  09/08/1993  . DEXA SCAN  09/08/2008  . PNA vac Low Risk Adult (2 of 2 - PCV13) 06/14/2014  . INFLUENZA VACCINE  05/27/2018  . TETANUS/TDAP  07/20/2024    Objective:  Vitals:   09/16/18 0839  BP: (!) 192/77  Pulse: 60  Temp: 98.5 F (36.9 C)  Weight: 147 lb (66.7 kg)   Body mass index is 23.73 kg/m.  Physical Exam  Constitutional: She is oriented to person, place, and time.  She is in very good spirits today.  Cardiovascular: Normal rate, regular rhythm and normal heart sounds.  Pulmonary/Chest: Effort normal and breath sounds normal.  Neurological: She is alert and oriented to person, place, and time.  Skin: No rash noted.  Psychiatric: She has a normal mood and affect.    Lab Results Lab Results  Component Value Date   WBC 4.8 09/02/2018   HGB 11.4 (L) 09/02/2018   HCT 35.4 09/02/2018   MCV 89.8 09/02/2018   PLT 300 09/02/2018    Lab Results  Component  Value Date   CREATININE 1.06 (H) 09/02/2018   BUN 18 09/02/2018   NA 136 09/02/2018   K 4.4 09/02/2018   CL 101 09/02/2018   CO2 26 09/02/2018    Lab Results  Component Value Date   ALT 17 09/02/2018   AST 27 09/02/2018   ALKPHOS 94 02/19/2017   BILITOT 0.5 09/02/2018    Lab Results  Component Value Date   CHOL 209 (H) 02/19/2017   HDL 68 02/19/2017   LDLCALC 126 (H) 02/19/2017   TRIG 77 02/19/2017   CHOLHDL 3.1 02/19/2017   Lab Results  Component Value Date   LABRPR NON-REACTIVE 09/02/2018   HIV 1 RNA Quant (copies/mL)  Date Value  09/02/2018 <20 NOT DETECTED  03/04/2018 <20 DETECTED (A)  08/25/2017 62 (H)   CD4 T Cell Abs (/uL)  Date Value  09/02/2018 420  03/04/2018 440  08/25/2017 440     Problem List Items Addressed This Visit      High   Human immunodeficiency virus (HIV) disease ([place])    Her  infection remains under excellent, long-term control.  She received her influenza vaccine today.  She will continue [facility] and follow-up after lab work in 6 months.      Relevant Orders   T-helper cell (CD4)- ([facility] clinic only)   HIV-1 RNA quant-no reflex-bld   CBC   Comprehensive metabolic panel   RPR     Unprioritized   Essential hypertension    Her blood pressure has been consistently above goal.  She has seen Dr. Criss Rosales her PCP who is also aware of this.           Michel Bickers, MD [facility] for [place] [facility] [phone] pager   [phone] cell 09/16/2018, 8:54 AM

## 2018-09-16 NOTE — Assessment & Plan Note (Signed)
Her infection remains under excellent, long-term control.  She received her influenza vaccine today.  She will continue [facility] and follow-up after lab work in 6 months.

## 2018-09-16 NOTE — Assessment & Plan Note (Signed)
Her blood pressure has been consistently above goal.  She has seen Dr. Criss Rosales her PCP who is also aware of this.

## 2018-11-04 DIAGNOSIS — Z961 Presence of intraocular lens: Secondary | ICD-10-CM | POA: Diagnosis not present

## 2018-11-04 DIAGNOSIS — H04123 Dry eye syndrome of bilateral lacrimal glands: Secondary | ICD-10-CM | POA: Diagnosis not present

## 2018-11-04 DIAGNOSIS — H43393 Other vitreous opacities, bilateral: Secondary | ICD-10-CM | POA: Diagnosis not present

## 2018-12-10 ENCOUNTER — Ambulatory Visit: Payer: Medicare Other

## 2018-12-13 ENCOUNTER — Encounter: Payer: Self-pay | Admitting: Internal Medicine

## 2019-03-17 ENCOUNTER — Other Ambulatory Visit: Payer: Medicare Other

## 2019-03-17 ENCOUNTER — Other Ambulatory Visit: Payer: Self-pay

## 2019-03-17 DIAGNOSIS — B20 Human immunodeficiency virus [HIV] disease: Secondary | ICD-10-CM

## 2019-03-18 LAB — T-HELPER CELL (CD4) - (RCID CLINIC ONLY)
CD4 % Helper T Cell: 35 % (ref 33–65)
CD4 T Cell Abs: 542 /uL (ref 400–1790)

## 2019-03-22 ENCOUNTER — Other Ambulatory Visit: Payer: Self-pay | Admitting: Internal Medicine

## 2019-03-22 DIAGNOSIS — B20 Human immunodeficiency virus [HIV] disease: Secondary | ICD-10-CM

## 2019-03-22 LAB — CBC
HCT: 33.3 % — ABNORMAL LOW (ref 35.0–45.0)
Hemoglobin: 10.7 g/dL — ABNORMAL LOW (ref 11.7–15.5)
MCH: 29.3 pg (ref 27.0–33.0)
MCHC: 32.1 g/dL (ref 32.0–36.0)
MCV: 91.2 fL (ref 80.0–100.0)
MPV: 10.1 fL (ref 7.5–12.5)
Platelets: 311 10*3/uL (ref 140–400)
RBC: 3.65 10*6/uL — ABNORMAL LOW (ref 3.80–5.10)
RDW: 12.9 % (ref 11.0–15.0)
WBC: 4.5 10*3/uL (ref 3.8–10.8)

## 2019-03-22 LAB — HIV-1 RNA QUANT-NO REFLEX-BLD
HIV 1 RNA Quant: <20 copies/mL
HIV-1 RNA Quant, Log: <1.3 Log copies/mL

## 2019-03-22 LAB — COMPREHENSIVE METABOLIC PANEL
AG Ratio: 1.2 (calc) (ref 1.0–2.5)
ALT: 8 U/L (ref 6–29)
AST: 17 U/L (ref 10–35)
Albumin: 4 g/dL (ref 3.6–5.1)
Alkaline phosphatase (APISO): 96 U/L (ref 37–153)
BUN/Creatinine Ratio: 14 (calc) (ref 6–22)
BUN: 15 mg/dL (ref 7–25)
CO2: 25 mmol/L (ref 20–32)
Calcium: 9.5 mg/dL (ref 8.6–10.4)
Chloride: 104 mmol/L (ref 98–110)
Creat: 1.06 mg/dL — ABNORMAL HIGH (ref 0.60–0.93)
Globulin: 3.4 g/dL (calc) (ref 1.9–3.7)
Glucose, Bld: 65 mg/dL (ref 65–99)
Potassium: 4 mmol/L (ref 3.5–5.3)
Sodium: 138 mmol/L (ref 135–146)
Total Bilirubin: 0.4 mg/dL (ref 0.2–1.2)
Total Protein: 7.4 g/dL (ref 6.1–8.1)

## 2019-03-22 LAB — RPR: RPR Ser Ql: NONREACTIVE

## 2019-04-04 ENCOUNTER — Ambulatory Visit (INDEPENDENT_AMBULATORY_CARE_PROVIDER_SITE_OTHER): Payer: Medicare Other | Admitting: Internal Medicine

## 2019-04-04 ENCOUNTER — Encounter: Payer: Self-pay | Admitting: Internal Medicine

## 2019-04-04 ENCOUNTER — Other Ambulatory Visit: Payer: Self-pay

## 2019-04-04 VITALS — BP 172/69 | HR 54 | Temp 98.4°F | Ht 66.0 in | Wt 145.0 lb

## 2019-04-04 DIAGNOSIS — B20 Human immunodeficiency virus [HIV] disease: Secondary | ICD-10-CM | POA: Diagnosis not present

## 2019-04-04 DIAGNOSIS — Z23 Encounter for immunization: Secondary | ICD-10-CM

## 2019-04-04 NOTE — Progress Notes (Signed)
Patient Active Problem List   Diagnosis Date Noted  . Human immunodeficiency virus (HIV) disease ([place]) 12/14/2006    Priority: High  . Renal insufficiency 09/02/2016  . Unintentional weight loss 09/02/2016  . Face lesion 05/19/2013  . Cataracts, bilateral 08/12/2012  . 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 ANXIETY 12/07/2007  . LUMPECTOMY, BREAST, HX OF 12/16/2006  . TAH/BSO, HX OF 12/16/2006  . HYPERLIPIDEMIA 12/14/2006  . ANEMIA NEC 12/14/2006  . IRITIS 12/14/2006  . Essential hypertension 12/14/2006  . REFLUX ESOPHAGITIS 12/14/2006  . BREAST CANCER, HX OF 12/14/2006    Patient's Medications  New Prescriptions   No medications on file  Previous Medications   AMLODIPINE (NORVASC) 5 MG TABLET    Take 5 mg by mouth daily.   ASCORBIC ACID (VITAMIN C) 100 MG TABLET    Take 100 mg by mouth daily.   BYSTOLIC 10 MG TABLET    TK 1 T PO D   CALCIUM CARBONATE 200 MG CAPSULE    Take 250 mg by mouth 2 (two) times daily with a meal.   ODEFSEY 200-25-25 MG TABS TABLET    TAKE 1 TABLET BY MOUTH DAILY   SPIRONOLACTONE (ALDACTONE) 12.5 MG TABS    Take 12.5 mg by mouth daily.     VITAMIN D, ERGOCALCIFEROL, (DRISDOL) 50000 UNITS CAPS    Take 2,000 Units by mouth daily.   Modified Medications   No medications on file  Discontinued Medications   DIFLUPREDNATE (DUREZOL) 0.05 % EMUL    Apply to eye.   NEBIVOLOL HCL (BYSTOLIC) 20 MG TABS    Take 1 tablet by mouth daily.      Subjective: Madison Newman is in for her routine HIV follow-up visit.  She has had no problems obtaining, taking or tolerating her Odefsey and never misses a single dose.  She has not had any change in her other medications.  She has been walking 4 to 6 miles daily and playing golf each Sunday during the [place] pandemic.  She is feeling well.  She recently wore a 24-hour blood pressure monitor and tells me that her blood pressure, on average once normal.  Review of Systems: Review of Systems  Constitutional: Negative for chills,  diaphoresis, fever and weight loss.  Gastrointestinal: Negative for abdominal pain, nausea and vomiting.    Past Medical History:  Diagnosis Date  . Hypertension     Social History   Tobacco Use  . Smoking status: Never Smoker  . Smokeless tobacco: Never Used  Substance Use Topics  . Alcohol use: No    Alcohol/week: 0.0 standard drinks  . Drug use: No    No family history on file.  No Known Allergies  Health Maintenance  Topic Date Due  . COLONOSCOPY  09/08/1993  . DEXA SCAN  09/08/2008  . PNA vac Low Risk Adult (2 of 2 - PCV13) 06/14/2014  . INFLUENZA VACCINE  05/28/2019  . TETANUS/TDAP  07/20/2024    Objective:  Vitals:   04/04/19 0845  BP: (!) 172/69  Pulse: (!) 54  Temp: 98.4 F (36.9 C)  TempSrc: Oral  SpO2: 100%  Weight: 145 lb (65.8 kg)  Height: 5\' 6"  (1.676 m)   Body mass index is 23.4 kg/m.  Physical Exam Constitutional:      Comments: She is in good spirits.  Cardiovascular:     Rate and Rhythm: Normal rate and regular rhythm.     Heart sounds: No murmur.  Pulmonary:  Effort: Pulmonary effort is normal.     Breath sounds: Normal breath sounds.  Psychiatric:        Mood and Affect: Mood normal.     Lab Results Lab Results  Component Value Date   WBC 4.5 03/17/2019   HGB 10.7 (L) 03/17/2019   HCT 33.3 (L) 03/17/2019   MCV 91.2 03/17/2019   PLT 311 03/17/2019    Lab Results  Component Value Date   CREATININE 1.06 (H) 03/17/2019   BUN 15 03/17/2019   NA 138 03/17/2019   K 4.0 03/17/2019   CL 104 03/17/2019   CO2 25 03/17/2019    Lab Results  Component Value Date   ALT 8 03/17/2019   AST 17 03/17/2019   ALKPHOS 94 02/19/2017   BILITOT 0.4 03/17/2019    Lab Results  Component Value Date   CHOL 209 (H) 02/19/2017   HDL 68 02/19/2017   LDLCALC 126 (H) 02/19/2017   TRIG 77 02/19/2017   CHOLHDL 3.1 02/19/2017   Lab Results  Component Value Date   LABRPR NON-REACTIVE 03/17/2019   HIV 1 RNA Quant (copies/mL)  Date  Value  03/17/2019 <20 NOT DETECTED  09/02/2018 <20 NOT DETECTED  03/04/2018 <20 DETECTED (A)   CD4 T Cell Abs (/uL)  Date Value  03/17/2019 542  09/02/2018 420  03/04/2018 440     Problem List Items Addressed This Visit      High   Human immunodeficiency virus (HIV) disease ([place])    Her infection remains under perfect, long-term control.  She will continue [facility] and follow-up after blood work in 6 months.      Relevant Orders   T-helper cell (CD4)- ([facility] clinic only)   HIV-1 RNA quant-no reflex-bld   CBC   Comprehensive metabolic panel        Michel Bickers, MD [facility] for [place] [phone] pager   [phone] cell 04/04/2019, 9:02 AM

## 2019-04-04 NOTE — Assessment & Plan Note (Signed)
Her infection remains under perfect, long-term control.  She will continue [facility] and follow-up after blood work in 6 months.

## 2019-04-08 NOTE — Addendum Note (Signed)
Addended by: Landis Gandy on: 04/08/2019 05:11 PM   Modules accepted: Orders

## 2019-04-18 ENCOUNTER — Other Ambulatory Visit: Payer: Self-pay | Admitting: Internal Medicine

## 2019-04-18 DIAGNOSIS — B20 Human immunodeficiency virus [HIV] disease: Secondary | ICD-10-CM

## 2019-05-30 DIAGNOSIS — E782 Mixed hyperlipidemia: Secondary | ICD-10-CM | POA: Diagnosis not present

## 2019-05-30 DIAGNOSIS — R7309 Other abnormal glucose: Secondary | ICD-10-CM | POA: Diagnosis not present

## 2019-05-30 DIAGNOSIS — I10 Essential (primary) hypertension: Secondary | ICD-10-CM | POA: Diagnosis not present

## 2019-05-30 DIAGNOSIS — Z6823 Body mass index (BMI) 23.0-23.9, adult: Secondary | ICD-10-CM | POA: Diagnosis not present

## 2019-06-29 ENCOUNTER — Encounter: Payer: Self-pay | Admitting: Internal Medicine

## 2019-10-04 ENCOUNTER — Other Ambulatory Visit: Payer: Self-pay

## 2019-10-04 ENCOUNTER — Other Ambulatory Visit: Payer: Medicare Other

## 2019-10-04 DIAGNOSIS — B20 Human immunodeficiency virus [HIV] disease: Secondary | ICD-10-CM

## 2019-10-05 LAB — T-HELPER CELL (CD4) - (RCID CLINIC ONLY)
CD4 % Helper T Cell: 35 % (ref 33–65)
CD4 T Cell Abs: 702 /uL (ref 400–1790)

## 2019-10-15 LAB — COMPREHENSIVE METABOLIC PANEL
AG Ratio: 1.1 (calc) (ref 1.0–2.5)
ALT: 9 U/L (ref 6–29)
AST: 17 U/L (ref 10–35)
Albumin: 4.1 g/dL (ref 3.6–5.1)
Alkaline phosphatase (APISO): 102 U/L (ref 37–153)
BUN/Creatinine Ratio: 18 (calc) (ref 6–22)
BUN: 19 mg/dL (ref 7–25)
CO2: 26 mmol/L (ref 20–32)
Calcium: 10.2 mg/dL (ref 8.6–10.4)
Chloride: 104 mmol/L (ref 98–110)
Creat: 1.07 mg/dL — ABNORMAL HIGH (ref 0.60–0.93)
Globulin: 3.7 g/dL (calc) (ref 1.9–3.7)
Glucose, Bld: 89 mg/dL (ref 65–99)
Potassium: 4.1 mmol/L (ref 3.5–5.3)
Sodium: 137 mmol/L (ref 135–146)
Total Bilirubin: 0.4 mg/dL (ref 0.2–1.2)
Total Protein: 7.8 g/dL (ref 6.1–8.1)

## 2019-10-15 LAB — CBC
HCT: 34.1 % — ABNORMAL LOW (ref 35.0–45.0)
Hemoglobin: 11.1 g/dL — ABNORMAL LOW (ref 11.7–15.5)
MCH: 29.7 pg (ref 27.0–33.0)
MCHC: 32.6 g/dL (ref 32.0–36.0)
MCV: 91.2 fL (ref 80.0–100.0)
MPV: 9.8 fL (ref 7.5–12.5)
Platelets: 299 10*3/uL (ref 140–400)
RBC: 3.74 10*6/uL — ABNORMAL LOW (ref 3.80–5.10)
RDW: 12.8 % (ref 11.0–15.0)
WBC: 4.6 10*3/uL (ref 3.8–10.8)

## 2019-10-15 LAB — HIV-1 RNA QUANT-NO REFLEX-BLD
HIV 1 RNA Quant: <20 copies/mL
HIV-1 RNA Quant, Log: <1.3 Log copies/mL

## 2019-10-18 ENCOUNTER — Encounter: Payer: Self-pay | Admitting: Internal Medicine

## 2019-10-18 ENCOUNTER — Ambulatory Visit (INDEPENDENT_AMBULATORY_CARE_PROVIDER_SITE_OTHER): Payer: Medicare Other | Admitting: Internal Medicine

## 2019-10-18 ENCOUNTER — Other Ambulatory Visit: Payer: Self-pay

## 2019-10-18 DIAGNOSIS — B20 Human immunodeficiency virus [HIV] disease: Secondary | ICD-10-CM | POA: Diagnosis present

## 2019-10-18 DIAGNOSIS — Z23 Encounter for immunization: Secondary | ICD-10-CM

## 2019-10-18 NOTE — Progress Notes (Signed)
Patient Active Problem List   Diagnosis Date Noted  . Human immunodeficiency virus (HIV) disease ([place]) 12/14/2006    Priority: High  . Renal insufficiency 09/02/2016  . Unintentional weight loss 09/02/2016  . Face lesion 05/19/2013  . Cataracts, bilateral 08/12/2012  . 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 ANXIETY 12/07/2007  . LUMPECTOMY, BREAST, HX OF 12/16/2006  . TAH/BSO, HX OF 12/16/2006  . HYPERLIPIDEMIA 12/14/2006  . ANEMIA NEC 12/14/2006  . IRITIS 12/14/2006  . Essential hypertension 12/14/2006  . REFLUX ESOPHAGITIS 12/14/2006  . BREAST CANCER, HX OF 12/14/2006    Patient's Medications  New Prescriptions   No medications on file  Previous Medications   AMLODIPINE (NORVASC) 5 MG TABLET    Take 5 mg by mouth daily.   ASCORBIC ACID (VITAMIN C) 100 MG TABLET    Take 100 mg by mouth daily.   BYSTOLIC 10 MG TABLET    TK 1 T PO D   CALCIUM CARBONATE 200 MG CAPSULE    Take 250 mg by mouth 2 (two) times daily with a meal.   ODEFSEY 200-25-25 MG TABS TABLET    TAKE 1 TABLET BY MOUTH DAILY   SPIRONOLACTONE (ALDACTONE) 12.5 MG TABS    Take 12.5 mg by mouth daily.     VITAMIN D, ERGOCALCIFEROL, (DRISDOL) 50000 UNITS CAPS    Take 2,000 Units by mouth daily.   Modified Medications   No medications on file  Discontinued Medications   No medications on file    Subjective: Madison Newman is in for her routine HIV follow-up visit.  She has not had any problems obtaining, taking or tolerating her Odefsey.  She does not miss doses.  He has been mainly staying at home during the Covid pandemic.  She has been doing a lot of walking and had been playing golf until it turned colder recently.  Review of Systems: Review of Systems  Constitutional: Negative for fever.  Respiratory: Negative for cough and shortness of breath.   Cardiovascular: Negative for chest pain.    Past Medical History:  Diagnosis Date  . Hypertension     Social History   Tobacco Use  . Smoking status: Never Smoker    . Smokeless tobacco: Never Used  Substance Use Topics  . Alcohol use: No    Alcohol/week: 0.0 standard drinks  . Drug use: No    No family history on file.  No Known Allergies  Health Maintenance  Topic Date Due  . DEXA SCAN  09/08/2008  . INFLUENZA VACCINE  05/28/2019  . TETANUS/TDAP  07/20/2024  . PNA vac Low Risk Adult  Completed    Objective:  Vitals:   10/18/19 0931  BP: (!) 197/72  Pulse: (!) 56  Temp: 98.6 F (37 C)  TempSrc: Oral  Weight: 147 lb (66.7 kg)   Body mass index is 23.73 kg/m.  Physical Exam Constitutional:      Comments: She is in her usual good spirits.  Cardiovascular:     Rate and Rhythm: Normal rate and regular rhythm.     Heart sounds: No murmur.  Pulmonary:     Effort: Pulmonary effort is normal.     Breath sounds: Normal breath sounds.  Psychiatric:        Mood and Affect: Mood normal.     Lab Results Lab Results  Component Value Date   WBC 4.6 10/04/2019   HGB 11.1 (L) 10/04/2019   HCT 34.1 (L) 10/04/2019   MCV  91.2 10/04/2019   PLT 299 10/04/2019    Lab Results  Component Value Date   CREATININE 1.07 (H) 10/04/2019   BUN 19 10/04/2019   NA 137 10/04/2019   K 4.1 10/04/2019   CL 104 10/04/2019   CO2 26 10/04/2019    Lab Results  Component Value Date   ALT 9 10/04/2019   AST 17 10/04/2019   ALKPHOS 94 02/19/2017   BILITOT 0.4 10/04/2019    Lab Results  Component Value Date   CHOL 209 (H) 02/19/2017   HDL 68 02/19/2017   LDLCALC 126 (H) 02/19/2017   TRIG 77 02/19/2017   CHOLHDL 3.1 02/19/2017   Lab Results  Component Value Date   LABRPR NON-REACTIVE 03/17/2019   HIV 1 RNA Quant (copies/mL)  Date Value  10/04/2019 <20 NOT DETECTED  03/17/2019 <20 NOT DETECTED  09/02/2018 <20 NOT DETECTED   CD4 T Cell Abs (/uL)  Date Value  10/04/2019 702  03/17/2019 542  09/02/2018 420     Problem List Items Addressed This Visit      High   Human immunodeficiency virus (HIV) disease ([place])   Relevant Orders    1 Year CBC   1 Year CD4   1 Year CMP   1 Year RPR   1 Year VL        Michel Bickers, MD [facility] for [place] [phone] pager   [phone] cell 10/18/2019, 10:08 AM

## 2019-10-18 NOTE — Assessment & Plan Note (Signed)
Her infection remains under excellent, long-term control.  She received her influenza vaccine today.  She will continue [facility] and follow-up after lab work in 1 year.

## 2019-11-08 DIAGNOSIS — H04123 Dry eye syndrome of bilateral lacrimal glands: Secondary | ICD-10-CM | POA: Diagnosis not present

## 2019-11-08 DIAGNOSIS — H35433 Paving stone degeneration of retina, bilateral: Secondary | ICD-10-CM | POA: Diagnosis not present

## 2019-11-08 DIAGNOSIS — Z961 Presence of intraocular lens: Secondary | ICD-10-CM | POA: Diagnosis not present

## 2019-11-18 ENCOUNTER — Ambulatory Visit: Payer: Medicare Other | Attending: Internal Medicine

## 2019-11-18 DIAGNOSIS — Z23 Encounter for immunization: Secondary | ICD-10-CM | POA: Insufficient documentation

## 2019-11-18 NOTE — Progress Notes (Signed)
   Covid-19 Vaccination Clinic  Name:  [place] W Kevorkian    MRN: [id number] DOB: [birth year]  11/18/2019  Madison Newman was observed post Covid-19 immunization for 15 minutes without incidence. She was provided with Vaccine Information Sheet and instruction to access the V-Safe system.   Ms. Kil was instructed to call 911 with any severe reactions post vaccine: Marland Kitchen Difficulty breathing  . Swelling of your face and throat  . A fast heartbeat  . A bad rash all over your body  . Dizziness and weakness    Immunizations Administered    Name Date Dose VIS Date Route   Pfizer COVID-19 Vaccine 11/18/2019  9:17 AM 0.3 mL 10/07/2019 Intramuscular   Manufacturer: [place]   Lot: [id number]   [place]: [id number]

## 2019-12-09 ENCOUNTER — Ambulatory Visit: Payer: Medicare Other | Attending: Internal Medicine

## 2019-12-09 DIAGNOSIS — Z23 Encounter for immunization: Secondary | ICD-10-CM | POA: Insufficient documentation

## 2019-12-09 NOTE — Progress Notes (Signed)
   Covid-19 Vaccination Clinic  Name:  Madison Newman    MRN: [id number] DOB: 1941/10/21  12/09/2019  [name] was observed post Covid-19 immunization for 15 minutes without incidence. She was provided with Vaccine Information Sheet and instruction to access the V-Safe system.   [name] was instructed to call 911 with any severe reactions post vaccine: Marland Kitchen Difficulty breathing  . Swelling of your face and throat  . A fast heartbeat  . A bad rash all over your body  . Dizziness and weakness    Immunizations Administered    Name Date Dose VIS Date Route   Pfizer COVID-19 Vaccine 12/09/2019  1:01 PM 0.3 mL 10/07/2019 Intramuscular   Manufacturer: [place]   Lot: [id number]   [place]: [id number]

## 2019-12-12 ENCOUNTER — Other Ambulatory Visit: Payer: Self-pay

## 2019-12-12 ENCOUNTER — Ambulatory Visit: Payer: Medicare Other

## 2019-12-22 ENCOUNTER — Encounter: Payer: Self-pay | Admitting: Internal Medicine

## 2020-01-31 DIAGNOSIS — Z6824 Body mass index (BMI) 24.0-24.9, adult: Secondary | ICD-10-CM | POA: Diagnosis not present

## 2020-01-31 DIAGNOSIS — I10 Essential (primary) hypertension: Secondary | ICD-10-CM | POA: Diagnosis not present

## 2020-01-31 DIAGNOSIS — B20 Human immunodeficiency virus [HIV] disease: Secondary | ICD-10-CM | POA: Diagnosis not present

## 2020-01-31 DIAGNOSIS — E782 Mixed hyperlipidemia: Secondary | ICD-10-CM | POA: Diagnosis not present

## 2020-04-19 ENCOUNTER — Other Ambulatory Visit: Payer: Self-pay | Admitting: Internal Medicine

## 2020-04-19 DIAGNOSIS — B20 Human immunodeficiency virus [HIV] disease: Secondary | ICD-10-CM

## 2020-07-17 ENCOUNTER — Ambulatory Visit: Payer: Medicare Other

## 2020-07-17 ENCOUNTER — Other Ambulatory Visit: Payer: Self-pay

## 2020-07-18 ENCOUNTER — Encounter: Payer: Self-pay | Admitting: Internal Medicine

## 2020-07-25 DIAGNOSIS — Z23 Encounter for immunization: Secondary | ICD-10-CM | POA: Diagnosis not present

## 2020-10-01 ENCOUNTER — Other Ambulatory Visit: Payer: Medicare Other

## 2020-10-01 ENCOUNTER — Other Ambulatory Visit: Payer: Self-pay

## 2020-10-01 DIAGNOSIS — B20 Human immunodeficiency virus [HIV] disease: Secondary | ICD-10-CM | POA: Diagnosis not present

## 2020-10-02 LAB — T-HELPER CELL (CD4) - (RCID CLINIC ONLY)
CD4 % Helper T Cell: 36 % (ref 33–65)
CD4 T Cell Abs: 670 /uL (ref 400–1790)

## 2020-10-03 LAB — CBC
HCT: 34.8 % — ABNORMAL LOW (ref 35.0–45.0)
Hemoglobin: 11.4 g/dL — ABNORMAL LOW (ref 11.7–15.5)
MCH: 29.5 pg (ref 27.0–33.0)
MCHC: 32.8 g/dL (ref 32.0–36.0)
MCV: 89.9 fL (ref 80.0–100.0)
MPV: 10 fL (ref 7.5–12.5)
Platelets: 300 10*3/uL (ref 140–400)
RBC: 3.87 10*6/uL (ref 3.80–5.10)
RDW: 12.8 % (ref 11.0–15.0)
WBC: 4.6 10*3/uL (ref 3.8–10.8)

## 2020-10-03 LAB — COMPREHENSIVE METABOLIC PANEL
AG Ratio: 1.3 (calc) (ref 1.0–2.5)
ALT: 11 U/L (ref 6–29)
AST: 20 U/L (ref 10–35)
Albumin: 4.4 g/dL (ref 3.6–5.1)
Alkaline phosphatase (APISO): 99 U/L (ref 37–153)
BUN/Creatinine Ratio: 16 (calc) (ref 6–22)
BUN: 18 mg/dL (ref 7–25)
CO2: 28 mmol/L (ref 20–32)
Calcium: 10.1 mg/dL (ref 8.6–10.4)
Chloride: 105 mmol/L (ref 98–110)
Creat: 1.1 mg/dL — ABNORMAL HIGH (ref 0.60–0.93)
Globulin: 3.3 g/dL (calc) (ref 1.9–3.7)
Glucose, Bld: 84 mg/dL (ref 65–99)
Potassium: 5 mmol/L (ref 3.5–5.3)
Sodium: 139 mmol/L (ref 135–146)
Total Bilirubin: 0.6 mg/dL (ref 0.2–1.2)
Total Protein: 7.7 g/dL (ref 6.1–8.1)

## 2020-10-03 LAB — RPR: RPR Ser Ql: NONREACTIVE

## 2020-10-03 LAB — HIV-1 RNA QUANT-NO REFLEX-BLD
HIV 1 RNA Quant: <20 Copies/mL — ABNORMAL HIGH
HIV-1 RNA Quant, Log: <1.3 Log cps/mL — ABNORMAL HIGH

## 2020-10-16 ENCOUNTER — Encounter: Payer: Medicare Other | Admitting: Internal Medicine

## 2020-10-17 ENCOUNTER — Other Ambulatory Visit: Payer: Self-pay

## 2020-10-17 ENCOUNTER — Ambulatory Visit (INDEPENDENT_AMBULATORY_CARE_PROVIDER_SITE_OTHER): Payer: Medicare Other | Admitting: Internal Medicine

## 2020-10-17 ENCOUNTER — Encounter: Payer: Self-pay | Admitting: Internal Medicine

## 2020-10-17 VITALS — BP 182/73 | HR 53 | Wt 147.0 lb

## 2020-10-17 DIAGNOSIS — Z23 Encounter for immunization: Secondary | ICD-10-CM | POA: Diagnosis not present

## 2020-10-17 DIAGNOSIS — B20 Human immunodeficiency virus [HIV] disease: Secondary | ICD-10-CM | POA: Diagnosis not present

## 2020-10-17 DIAGNOSIS — I10 Essential (primary) hypertension: Secondary | ICD-10-CM

## 2020-10-17 MED ORDER — ODEFSEY 200-25-25 MG PO TABS: 1.0000 | ORAL_TABLET | Freq: Every day | ORAL | 11 refills | Status: DC

## 2020-10-17 NOTE — Progress Notes (Signed)
Patient Active Problem List   Diagnosis Date Noted  . Human immunodeficiency virus (HIV) disease ([place]) 12/14/2006    Priority: High  . Renal insufficiency 09/02/2016  . Unintentional weight loss 09/02/2016  . Face lesion 05/19/2013  . Cataracts, bilateral 08/12/2012  . 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 ANXIETY 12/07/2007  . LUMPECTOMY, BREAST, HX OF 12/16/2006  . TAH/BSO, HX OF 12/16/2006  . HYPERLIPIDEMIA 12/14/2006  . ANEMIA NEC 12/14/2006  . IRITIS 12/14/2006  . Essential hypertension 12/14/2006  . REFLUX ESOPHAGITIS 12/14/2006  . BREAST CANCER, HX OF 12/14/2006    Patient's Medications  New Prescriptions   No medications on file  Previous Medications   AMLODIPINE (NORVASC) 5 MG TABLET    Take 5 mg by mouth daily.   ASCORBIC ACID (VITAMIN C) 100 MG TABLET    Take 100 mg by mouth daily.   BYSTOLIC 10 MG TABLET    TK 1 T PO D   CALCIUM CARBONATE 200 MG CAPSULE    Take 250 mg by mouth 2 (two) times daily with a meal.   SPIRONOLACTONE (ALDACTONE) 12.5 MG TABS    Take 12.5 mg by mouth daily.     VITAMIN D, ERGOCALCIFEROL, (DRISDOL) 50000 UNITS CAPS    Take 2,000 Units by mouth daily.   Modified Medications   Modified Medication Previous Medication   EMTRICITABINE-RILPIVIR-TENOFOVIR AF (ODEFSEY) 200-25-25 MG TABS TABLET ODEFSEY 200-25-25 MG TABS tablet      Take 1 tablet by mouth daily.    TAKE 1 TABLET BY MOUTH DAILY  Discontinued Medications   No medications on file    Subjective: Madison Newman is in for her routine HIV follow-up visit.  She has not had any problems obtaining, taking or tolerating her Odefsey and she does not miss doses.  She is feeling well.  She is not on any new medications.  She gets regular exercise.  She denies feeling anxious or depressed.  Review of Systems: Review of Systems  Constitutional: Negative for fever and weight loss.  Psychiatric/Behavioral: Negative for depression. The patient is not nervous/anxious.     Past Medical History:   Diagnosis Date  . Hypertension     Social History   Tobacco Use  . Smoking status: Never Smoker  . Smokeless tobacco: Never Used  Substance Use Topics  . Alcohol use: No    Alcohol/week: 0.0 standard drinks  . Drug use: No    No family history on file.  No Known Allergies  Health Maintenance  Topic Date Due  . DEXA SCAN  Never done  . COVID-19 Vaccine (3 - Pfizer risk 4-dose series) 01/06/2020  . INFLUENZA VACCINE  05/27/2020  . TETANUS/TDAP  07/20/2024  . Hepatitis C Screening  Completed  . PNA vac Low Risk Adult  Completed    Objective:  Vitals:   10/17/20 0907 10/17/20 0923  BP:  (!) 182/73  Pulse: 63 (!) 53  Weight: 147 lb (66.7 kg)    Body mass index is 23.73 kg/m.  Physical Exam Constitutional:      Comments: Her spirits are good.  Cardiovascular:     Rate and Rhythm: Normal rate.  Pulmonary:     Effort: Pulmonary effort is normal.  Psychiatric:        Mood and Affect: Mood normal.     Lab Results Lab Results  Component Value Date   WBC 4.6 10/01/2020   HGB 11.4 (L) 10/01/2020   HCT 34.8 (L) 10/01/2020  MCV 89.9 10/01/2020   PLT 300 10/01/2020    Lab Results  Component Value Date   CREATININE 1.10 (H) 10/01/2020   BUN 18 10/01/2020   NA 139 10/01/2020   K 5.0 10/01/2020   CL 105 10/01/2020   CO2 28 10/01/2020    Lab Results  Component Value Date   ALT 11 10/01/2020   AST 20 10/01/2020   ALKPHOS 94 02/19/2017   BILITOT 0.6 10/01/2020    Lab Results  Component Value Date   CHOL 209 (H) 02/19/2017   HDL 68 02/19/2017   LDLCALC 126 (H) 02/19/2017   TRIG 77 02/19/2017   CHOLHDL 3.1 02/19/2017   Lab Results  Component Value Date   LABRPR NON-REACTIVE 10/01/2020   HIV 1 RNA Quant  Date Value  10/01/2020 <20 Copies/mL (H)  10/04/2019 <20 NOT DETECTED copies/mL  03/17/2019 <20 NOT DETECTED copies/mL   CD4 T Cell Abs (/uL)  Date Value  10/01/2020 670  10/04/2019 702  03/17/2019 542     Problem List Items Addressed  This Visit      High   Human immunodeficiency virus (HIV) disease ([place])    Her infection remains under excellent, long-term controlled.  Her viral load has been consistently undetectable.  She will continue [facility] and follow-up after lab work in 1 year.  She received her influenza vaccine today.      Relevant Medications   emtricitabine-rilpivir-tenofovir AF (ODEFSEY) 200-25-25 MG TABS tablet   Other Relevant Orders   CBC   T-helper cell (CD4)- ([facility] clinic only)   Comprehensive metabolic panel   RPR   HIV-1 RNA quant-no reflex-bld     Unprioritized   Essential hypertension    Her blood pressure remains above goal.  I encouraged her to follow-up with her PCP as soon as possible.           Michel Bickers, MD [facility] [phone] pager   [phone] cell 10/17/2020, 9:30 AM

## 2020-10-17 NOTE — Assessment & Plan Note (Signed)
Her blood pressure remains above goal.  I encouraged her to follow-up with her PCP as soon as possible.

## 2020-10-17 NOTE — Assessment & Plan Note (Signed)
Her infection remains under excellent, long-term controlled.  Her viral load has been consistently undetectable.  She will continue [facility] and follow-up after lab work in 1 year.  She received her influenza vaccine today.

## 2020-11-08 DIAGNOSIS — H43393 Other vitreous opacities, bilateral: Secondary | ICD-10-CM | POA: Diagnosis not present

## 2020-11-08 DIAGNOSIS — Z961 Presence of intraocular lens: Secondary | ICD-10-CM | POA: Diagnosis not present

## 2020-11-08 DIAGNOSIS — H04123 Dry eye syndrome of bilateral lacrimal glands: Secondary | ICD-10-CM | POA: Diagnosis not present

## 2021-01-08 ENCOUNTER — Encounter: Payer: Self-pay | Admitting: Internal Medicine

## 2021-01-30 DIAGNOSIS — Z23 Encounter for immunization: Secondary | ICD-10-CM | POA: Diagnosis not present

## 2021-07-10 DIAGNOSIS — Z23 Encounter for immunization: Secondary | ICD-10-CM | POA: Diagnosis not present

## 2021-08-06 ENCOUNTER — Ambulatory Visit: Payer: Medicare Other

## 2021-08-07 ENCOUNTER — Other Ambulatory Visit: Payer: Self-pay

## 2021-08-07 ENCOUNTER — Ambulatory Visit: Payer: Medicare Other

## 2021-10-01 ENCOUNTER — Other Ambulatory Visit: Payer: Self-pay

## 2021-10-01 ENCOUNTER — Other Ambulatory Visit: Payer: Medicare Other

## 2021-10-01 DIAGNOSIS — B20 Human immunodeficiency virus [HIV] disease: Secondary | ICD-10-CM | POA: Diagnosis not present

## 2021-10-02 LAB — T-HELPER CELL (CD4) - (RCID CLINIC ONLY)
CD4 % Helper T Cell: 34 % (ref 33–65)
CD4 T Cell Abs: 589 /uL (ref 400–1790)

## 2021-10-04 LAB — HIV-1 RNA QUANT-NO REFLEX-BLD
HIV 1 RNA Quant: NOT DETECTED Copies/mL
HIV-1 RNA Quant, Log: NOT DETECTED Log cps/mL

## 2021-10-04 LAB — COMPREHENSIVE METABOLIC PANEL
AG Ratio: 1.1 (calc) (ref 1.0–2.5)
ALT: 9 U/L (ref 6–29)
AST: 19 U/L (ref 10–35)
Albumin: 3.9 g/dL (ref 3.6–5.1)
Alkaline phosphatase (APISO): 101 U/L (ref 37–153)
BUN: 21 mg/dL (ref 7–25)
CO2: 30 mmol/L (ref 20–32)
Calcium: 9.8 mg/dL (ref 8.6–10.4)
Chloride: 103 mmol/L (ref 98–110)
Creat: 0.89 mg/dL (ref 0.60–1.00)
Globulin: 3.4 g/dL (calc) (ref 1.9–3.7)
Glucose, Bld: 86 mg/dL (ref 65–99)
Potassium: 4.5 mmol/L (ref 3.5–5.3)
Sodium: 138 mmol/L (ref 135–146)
Total Bilirubin: 0.6 mg/dL (ref 0.2–1.2)
Total Protein: 7.3 g/dL (ref 6.1–8.1)

## 2021-10-04 LAB — CBC
HCT: 33.7 % — ABNORMAL LOW (ref 35.0–45.0)
Hemoglobin: 11 g/dL — ABNORMAL LOW (ref 11.7–15.5)
MCH: 30 pg (ref 27.0–33.0)
MCHC: 32.6 g/dL (ref 32.0–36.0)
MCV: 91.8 fL (ref 80.0–100.0)
MPV: 9.8 fL (ref 7.5–12.5)
Platelets: 293 10*3/uL (ref 140–400)
RBC: 3.67 10*6/uL — ABNORMAL LOW (ref 3.80–5.10)
RDW: 12.5 % (ref 11.0–15.0)
WBC: 5.3 10*3/uL (ref 3.8–10.8)

## 2021-10-04 LAB — RPR: RPR Ser Ql: NONREACTIVE

## 2021-10-16 ENCOUNTER — Other Ambulatory Visit: Payer: Self-pay

## 2021-10-16 ENCOUNTER — Encounter: Payer: Self-pay | Admitting: Internal Medicine

## 2021-10-16 ENCOUNTER — Ambulatory Visit (INDEPENDENT_AMBULATORY_CARE_PROVIDER_SITE_OTHER): Payer: Medicare Other | Admitting: Internal Medicine

## 2021-10-16 VITALS — BP 186/71 | HR 71 | Temp 98.5°F | Wt 145.0 lb

## 2021-10-16 DIAGNOSIS — B20 Human immunodeficiency virus [HIV] disease: Secondary | ICD-10-CM

## 2021-10-16 DIAGNOSIS — U071 COVID-19: Secondary | ICD-10-CM | POA: Diagnosis not present

## 2021-10-16 DIAGNOSIS — Z23 Encounter for immunization: Secondary | ICD-10-CM

## 2021-10-16 MED ORDER — ODEFSEY 200-25-25 MG PO TABS: 1.0000 | ORAL_TABLET | Freq: Every day | ORAL | 11 refills | Status: DC

## 2021-10-16 NOTE — Progress Notes (Signed)
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   ASCORBIC ACID (VITAMIN C) 100 MG TABLET    Take 100 mg by mouth daily.   BYSTOLIC 10 MG TABLET    TK 1 T PO D   CALCIUM CARBONATE 200 MG CAPSULE    Take 250 mg by mouth 2 (two) times daily with a meal.   SPIRONOLACTONE (ALDACTONE) 12.5 MG TABS    Take 12.5 mg by mouth daily.     VITAMIN D, ERGOCALCIFEROL, (DRISDOL) 50000 UNITS CAPS    Take 2,000 Units by mouth daily.   Modified Medications   Modified Medication Previous Medication   EMTRICITABINE-RILPIVIR-TENOFOVIR AF (ODEFSEY) 200-25-25 MG TABS TABLET emtricitabine-rilpivir-tenofovir AF (ODEFSEY) 200-25-25 MG TABS tablet      Take 1 tablet by mouth daily.    Take 1 tablet by mouth daily.  Discontinued Medications   No medications on file    Subjective: Madison Newman is in for her routine HIV follow-up visit.  She denies any problems obtaining, taking or tolerating her Odefsey and never misses doses.  She received her last COVID booster vaccine on 07/10/2021.  She recently came down with a sore throat, cough and malaise.  She tested positive for COVID.  She was evaluated at a [facility].  She says there was consideration of treating her with 1 oral medication but her symptoms were very mild and vital  signs were stable so she declined.  She started feeling better within a few days and now has only a very mild, residual cough.  She has not had her annual influenza vaccine yet.  Review of Systems: Review of Systems  Constitutional:  Negative for fever and weight loss.  Respiratory:  Positive for cough. Negative for sputum production and shortness of breath.   Cardiovascular:  Negative for chest pain.  Psychiatric/Behavioral:  Negative for depression.    Past Medical History:  Diagnosis Date   Hypertension     Social History   Tobacco Use   Smoking status: Never   Smokeless tobacco: Never  Substance Use Topics   Alcohol use: No    Alcohol/week: 0.0 standard drinks   Drug use: No    No family history on file.  No Known Allergies  Health Maintenance  Topic Date Due   Zoster Vaccines- Shingrix (1 of 2) Never done   DEXA SCAN  Never done   COVID-19 Vaccine (3 - Pfizer risk series) 01/06/2020   INFLUENZA VACCINE  05/27/2021   TETANUS/TDAP  07/20/2024   Pneumonia Vaccine 77+ Years old  Completed   Hepatitis C Screening  Completed   HPV VACCINES  Aged Out    Objective:  Vitals:   10/16/21 0921  BP: (!) 186/71  Pulse: 71  Temp: 98.5 F (36.9 C)  TempSrc: Oral  Weight: 145 lb (65.8 kg)   Body mass index is 23.4 kg/m.  Physical Exam Constitutional:      Comments: Her spirits are good as usual.  Cardiovascular:     Rate and Rhythm: Normal rate and regular rhythm.     Heart sounds: No murmur heard. Pulmonary:     Effort: Pulmonary effort is normal.     Breath sounds: Normal breath sounds.  Psychiatric:        Mood and Affect: Mood normal.    Lab Results Lab Results  Component Value Date   WBC 5.3 10/01/2021   HGB 11.0 (L) 10/01/2021   HCT 33.7 (L) 10/01/2021   MCV 91.8 10/01/2021   PLT 293 10/01/2021    Lab Results  Component Value Date   CREATININE 0.89 10/01/2021   BUN 21 10/01/2021   NA 138 10/01/2021   K 4.5 10/01/2021   CL 103 10/01/2021    CO2 30 10/01/2021    Lab Results  Component Value Date   ALT 9 10/01/2021   AST 19 10/01/2021   ALKPHOS 94 02/19/2017   BILITOT 0.6 10/01/2021    Lab Results  Component Value Date   CHOL 209 (H) 02/19/2017   HDL 68 02/19/2017   LDLCALC 126 (H) 02/19/2017   TRIG 77 02/19/2017   CHOLHDL 3.1 02/19/2017   Lab Results  Component Value Date   LABRPR NON-REACTIVE 10/01/2021   HIV 1 RNA Quant  Date Value  10/01/2021 Not Detected Copies/mL  10/01/2020 <20 Copies/mL (H)  10/04/2019 <20 NOT DETECTED copies/mL   CD4 T Cell Abs (/uL)  Date Value  10/01/2021 589  10/01/2020 670  10/04/2019 702     Problem List Items Addressed This Visit       High   Human immunodeficiency virus (HIV) disease ([place])    Her infection remains under excellent, long-term control.  She will continue [facility] and follow-up here after lab work in 1 year.  She received her annual influenza vaccine here today.      Relevant Medications   emtricitabine-rilpivir-tenofovir AF (ODEFSEY) 200-25-25 MG TABS tablet   Other Relevant Orders   CBC   T-helper cell (CD4)- ([facility] clinic only)   Comprehensive metabolic panel   RPR   HIV-1 RNA quant-no reflex-bld     Unprioritized   COVID-19    She recently had mild, breakthrough COVID infection that is resolving uneventfully and spontaneously.  I told her to wait at least 3 months before her next booster vaccine.      Relevant Medications   emtricitabine-rilpivir-tenofovir AF (ODEFSEY) 200-25-25 MG TABS tablet      Michel Bickers, MD [facility] for [place] [phone] pager   [phone] cell 10/16/2021, 9:43 AM

## 2021-10-16 NOTE — Assessment & Plan Note (Signed)
Her infection remains under excellent, long-term control.  She will continue [facility] and follow-up here after lab work in 1 year.  She received her annual influenza vaccine here today.

## 2021-10-16 NOTE — Assessment & Plan Note (Signed)
She recently had mild, breakthrough COVID infection that is resolving uneventfully and spontaneously.  I told her to wait at least 3 months before her next booster vaccine.

## 2022-01-16 DIAGNOSIS — Z961 Presence of intraocular lens: Secondary | ICD-10-CM | POA: Diagnosis not present

## 2022-01-16 DIAGNOSIS — H04123 Dry eye syndrome of bilateral lacrimal glands: Secondary | ICD-10-CM | POA: Diagnosis not present

## 2022-01-16 DIAGNOSIS — H43393 Other vitreous opacities, bilateral: Secondary | ICD-10-CM | POA: Diagnosis not present

## 2022-05-12 ENCOUNTER — Other Ambulatory Visit: Payer: Self-pay

## 2022-05-12 ENCOUNTER — Ambulatory Visit: Payer: Medicare Other

## 2022-10-01 ENCOUNTER — Other Ambulatory Visit: Payer: Medicare Other

## 2022-10-01 ENCOUNTER — Other Ambulatory Visit: Payer: Self-pay

## 2022-10-01 DIAGNOSIS — B20 Human immunodeficiency virus [HIV] disease: Secondary | ICD-10-CM | POA: Diagnosis not present

## 2022-10-02 LAB — T-HELPER CELL (CD4) - (RCID CLINIC ONLY)
CD4 % Helper T Cell: 35 % (ref 33–65)
CD4 T Cell Abs: 590 /uL (ref 400–1790)

## 2022-10-04 LAB — CBC
HCT: 35.8 % (ref 35.0–45.0)
Hemoglobin: 11.7 g/dL (ref 11.7–15.5)
MCH: 28.7 pg (ref 27.0–33.0)
MCHC: 32.7 g/dL (ref 32.0–36.0)
MCV: 88 fL (ref 80.0–100.0)
MPV: 9.7 fL (ref 7.5–12.5)
Platelets: 317 10*3/uL (ref 140–400)
RBC: 4.07 10*6/uL (ref 3.80–5.10)
RDW: 12.9 % (ref 11.0–15.0)
WBC: 5 10*3/uL (ref 3.8–10.8)

## 2022-10-04 LAB — COMPREHENSIVE METABOLIC PANEL
AG Ratio: 1.1 (calc) (ref 1.0–2.5)
ALT: 8 U/L (ref 6–29)
AST: 17 U/L (ref 10–35)
Albumin: 4.1 g/dL (ref 3.6–5.1)
Alkaline phosphatase (APISO): 141 U/L (ref 37–153)
BUN: 11 mg/dL (ref 7–25)
CO2: 28 mmol/L (ref 20–32)
Calcium: 9.9 mg/dL (ref 8.6–10.4)
Chloride: 101 mmol/L (ref 98–110)
Creat: 0.72 mg/dL (ref 0.60–1.00)
Globulin: 3.7 g/dL (calc) (ref 1.9–3.7)
Glucose, Bld: 93 mg/dL (ref 65–99)
Potassium: 3.9 mmol/L (ref 3.5–5.3)
Sodium: 139 mmol/L (ref 135–146)
Total Bilirubin: 0.5 mg/dL (ref 0.2–1.2)
Total Protein: 7.8 g/dL (ref 6.1–8.1)

## 2022-10-04 LAB — RPR: RPR Ser Ql: NONREACTIVE

## 2022-10-04 LAB — HIV-1 RNA QUANT-NO REFLEX-BLD
HIV 1 RNA Quant: NOT DETECTED Copies/mL
HIV-1 RNA Quant, Log: NOT DETECTED Log cps/mL

## 2022-10-15 ENCOUNTER — Other Ambulatory Visit: Payer: Self-pay

## 2022-10-15 ENCOUNTER — Ambulatory Visit (INDEPENDENT_AMBULATORY_CARE_PROVIDER_SITE_OTHER): Payer: Medicare Other | Admitting: Internal Medicine

## 2022-10-15 ENCOUNTER — Ambulatory Visit (INDEPENDENT_AMBULATORY_CARE_PROVIDER_SITE_OTHER): Payer: Medicare Other

## 2022-10-15 ENCOUNTER — Encounter: Payer: Self-pay | Admitting: Internal Medicine

## 2022-10-15 VITALS — BP 205/70 | HR 71 | Temp 98.0°F | Ht 66.0 in | Wt 140.0 lb

## 2022-10-15 DIAGNOSIS — Z23 Encounter for immunization: Secondary | ICD-10-CM | POA: Diagnosis not present

## 2022-10-15 DIAGNOSIS — B20 Human immunodeficiency virus [HIV] disease: Secondary | ICD-10-CM

## 2022-10-15 MED ORDER — ODEFSEY 200-25-25 MG PO TABS: 1.0000 | ORAL_TABLET | Freq: Every day | ORAL | 11 refills | Status: DC

## 2022-10-15 NOTE — Assessment & Plan Note (Signed)
Her infection remains under excellent, long-term control.  She received her annual influenza vaccine and an updated COVID-vaccine here today.  She will continue [facility] and follow-up here after blood work in 1 year.

## 2022-10-15 NOTE — Progress Notes (Signed)
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   ASCORBIC ACID (VITAMIN C) 100 MG TABLET    Take 100 mg by mouth daily.   BYSTOLIC 10 MG TABLET    TK 1 T PO D   CALCIUM CARBONATE 200 MG CAPSULE    Take 250 mg by mouth 2 (two) times daily with a meal.   SPIRONOLACTONE (ALDACTONE) 12.5 MG TABS    Take 12.5 mg by mouth daily.     VITAMIN D, ERGOCALCIFEROL, (DRISDOL) 50000 UNITS CAPS    Take 2,000 Units by mouth daily.   Modified Medications   Modified Medication Previous Medication   EMTRICITABINE-RILPIVIR-TENOFOVIR AF (ODEFSEY) 200-25-25 MG TABS TABLET emtricitabine-rilpivir-tenofovir AF (ODEFSEY) 200-25-25 MG TABS tablet      Take 1 tablet by mouth daily.    Take 1 tablet by mouth daily.  Discontinued Medications   No medications on file    Subjective: Madison Newman is in for Madison Newman routine HIV follow-up visit.  She has not had any problems obtaining, taking or tolerating Madison Newman Odefsey.  She takes it each evening before bedtime she estimates that she may miss 1-2 doses each month when she falls asleep before taking it.  She is on no other new medications since Madison Newman last visit.  She remains active playing golf and walking.  She recently walked in a 5K race in [place].  Review of Systems: Review of Systems   Constitutional:  Negative for fever and weight loss.    Past Medical History:  Diagnosis Date   Hypertension     Social History   Tobacco Use   Smoking status: Never   Smokeless tobacco: Never  Substance Use Topics   Alcohol use: No    Alcohol/week: 0.0 standard drinks of alcohol   Drug use: No    No family history on file.  No Known Allergies  Health Maintenance  Topic Date Due   Zoster Vaccines- Shingrix (1 of 2) Never done   DEXA SCAN  Never done   Medicare Annual Wellness (AWV)  09/26/2016   COVID-19 Vaccine (3 - Pfizer risk series) 01/06/2020   INFLUENZA VACCINE  05/27/2022   DTaP/Tdap/Td (2 - Td or Tdap) 07/20/2024   Pneumonia Vaccine 38+ Years old  Completed   Hepatitis C Screening  Completed   HPV VACCINES  Aged Out    Objective:  Vitals:   10/15/22 0820  BP: (!) 205/70  Pulse: 71  Temp: 98 F (36.7 C)  TempSrc: Temporal  Weight: 140 lb (63.5 kg)  Height: '5\' 6"'$  (1.676 m)   Body mass index is 22.6 kg/m.  Physical Exam Constitutional:      Comments: Madison Newman spirits are good as usual.  Cardiovascular:     Rate  and Rhythm: Normal rate.  Pulmonary:     Effort: Pulmonary effort is normal.  Psychiatric:        Mood and Affect: Mood normal.     Lab Results Lab Results  Component Value Date   WBC 5.0 10/01/2022   HGB 11.7 10/01/2022   HCT 35.8 10/01/2022   MCV 88.0 10/01/2022   PLT 317 10/01/2022    Lab Results  Component Value Date   CREATININE 0.72 10/01/2022   BUN 11 10/01/2022   NA 139 10/01/2022   K 3.9 10/01/2022   CL 101 10/01/2022   CO2 28 10/01/2022    Lab Results  Component Value Date   ALT 8 10/01/2022   AST 17 10/01/2022   ALKPHOS 94 02/19/2017   BILITOT 0.5 10/01/2022    Lab Results  Component Value Date   CHOL 209 (H) 02/19/2017   HDL 68 02/19/2017   LDLCALC 126 (H) 02/19/2017   TRIG 77 02/19/2017   CHOLHDL 3.1 02/19/2017   Lab Results  Component Value Date   LABRPR NON-REACTIVE 10/01/2022   HIV 1 RNA Quant  (Copies/mL)  Date Value  10/01/2022 Not Detected  10/01/2021 Not Detected  10/01/2020 <20 (H)   CD4 T Cell Abs (/uL)  Date Value  10/01/2022 590  10/01/2021 589  10/01/2020 670     Problem List Items Addressed This Visit       High   Human immunodeficiency virus (HIV) disease ([place]) - Primary    Madison Newman infection remains under excellent, long-term control.  She received Madison Newman annual influenza vaccine and an updated COVID-vaccine here today.  She will continue [facility] and follow-up here after blood work in 1 year.      Relevant Medications   emtricitabine-rilpivir-tenofovir AF (ODEFSEY) 200-25-25 MG TABS tablet   Other Relevant Orders   CBC   T-helper cells (CD4) count (not at [facility])   Comprehensive metabolic panel   RPR   HIV-1 RNA quant-no reflex-bld      Michel Bickers, MD [facility] for [place] [phone] pager   [phone] cell 10/15/2022, 8:57 AM

## 2023-01-22 DIAGNOSIS — H43393 Other vitreous opacities, bilateral: Secondary | ICD-10-CM | POA: Diagnosis not present

## 2023-01-22 DIAGNOSIS — Z961 Presence of intraocular lens: Secondary | ICD-10-CM | POA: Diagnosis not present

## 2023-01-22 DIAGNOSIS — H04123 Dry eye syndrome of bilateral lacrimal glands: Secondary | ICD-10-CM | POA: Diagnosis not present

## 2023-07-29 ENCOUNTER — Other Ambulatory Visit: Payer: Self-pay

## 2023-07-29 DIAGNOSIS — B20 Human immunodeficiency virus [HIV] disease: Secondary | ICD-10-CM

## 2023-07-29 DIAGNOSIS — Z113 Encounter for screening for infections with a predominantly sexual mode of transmission: Secondary | ICD-10-CM

## 2023-07-29 DIAGNOSIS — Z79899 Other long term (current) drug therapy: Secondary | ICD-10-CM

## 2023-07-29 NOTE — Progress Notes (Signed)
The ASCVD Risk score (Arnett DK, et al., 2019) failed to calculate for the following reasons:   The valid systolic blood pressure range is 90 to 200 mmHg   Cannot find a previous HDL lab   Cannot find a previous total cholesterol lab  Sandie Ano, RN

## 2023-09-28 ENCOUNTER — Other Ambulatory Visit: Payer: Self-pay

## 2023-09-28 ENCOUNTER — Other Ambulatory Visit: Payer: Medicare Other

## 2023-09-28 DIAGNOSIS — B20 Human immunodeficiency virus [HIV] disease: Secondary | ICD-10-CM

## 2023-09-28 DIAGNOSIS — Z113 Encounter for screening for infections with a predominantly sexual mode of transmission: Secondary | ICD-10-CM | POA: Diagnosis not present

## 2023-09-28 DIAGNOSIS — Z79899 Other long term (current) drug therapy: Secondary | ICD-10-CM | POA: Diagnosis not present

## 2023-09-29 LAB — CBC WITH DIFFERENTIAL/PLATELET
Absolute Lymphocytes: 2398 {cells}/uL (ref 850–3900)
Absolute Monocytes: 572 {cells}/uL (ref 200–950)
Basophils Absolute: 39 {cells}/uL (ref 0–200)
Basophils Relative: 0.7 %
Eosinophils Absolute: 83 {cells}/uL (ref 15–500)
Eosinophils Relative: 1.5 %
HCT: 37.2 % (ref 35.0–45.0)
Hemoglobin: 11.9 g/dL (ref 11.7–15.5)
MCH: 29.2 pg (ref 27.0–33.0)
MCHC: 32 g/dL (ref 32.0–36.0)
MCV: 91.2 fL (ref 80.0–100.0)
MPV: 10.1 fL (ref 7.5–12.5)
Monocytes Relative: 10.4 %
Neutro Abs: 2409 {cells}/uL (ref 1500–7800)
Neutrophils Relative %: 43.8 %
Platelets: 323 10*3/uL (ref 140–400)
RBC: 4.08 10*6/uL (ref 3.80–5.10)
RDW: 12.7 % (ref 11.0–15.0)
Total Lymphocyte: 43.6 %
WBC: 5.5 10*3/uL (ref 3.8–10.8)

## 2023-09-29 LAB — COMPLETE METABOLIC PANEL WITH GFR
AG Ratio: 1 (calc) (ref 1.0–2.5)
ALT: 12 U/L (ref 6–29)
AST: 18 U/L (ref 10–35)
Albumin: 4 g/dL (ref 3.6–5.1)
Alkaline phosphatase (APISO): 143 U/L (ref 37–153)
BUN: 14 mg/dL (ref 7–25)
CO2: 32 mmol/L (ref 20–32)
Calcium: 9.8 mg/dL (ref 8.6–10.4)
Chloride: 103 mmol/L (ref 98–110)
Creat: 0.91 mg/dL (ref 0.60–0.95)
Globulin: 3.9 g/dL — ABNORMAL HIGH (ref 1.9–3.7)
Glucose, Bld: 85 mg/dL (ref 65–99)
Potassium: 4.9 mmol/L (ref 3.5–5.3)
Sodium: 141 mmol/L (ref 135–146)
Total Bilirubin: 0.4 mg/dL (ref 0.2–1.2)
Total Protein: 7.9 g/dL (ref 6.1–8.1)
eGFR: 64 mL/min/{1.73_m2} (ref >=60)

## 2023-09-29 LAB — RPR: RPR Ser Ql: NONREACTIVE

## 2023-09-29 LAB — LIPID PANEL
Cholesterol: 242 mg/dL — ABNORMAL HIGH (ref <200)
HDL: 57 mg/dL (ref >=50)
LDL Cholesterol (Calc): 159 mg/dL — ABNORMAL HIGH
Non-HDL Cholesterol (Calc): 185 mg/dL — ABNORMAL HIGH (ref <130)
Total CHOL/HDL Ratio: 4.2 (calc) (ref <5.0)
Triglycerides: 133 mg/dL (ref <150)

## 2023-09-29 LAB — HIV-1 RNA QUANT-NO REFLEX-BLD
HIV 1 RNA Quant: NOT DETECTED {copies}/mL
HIV-1 RNA Quant, Log: NOT DETECTED {Log_copies}/mL

## 2023-09-30 LAB — T-HELPER CELL (CD4) - (RCID CLINIC ONLY)
CD4 % Helper T Cell: 36 % (ref 33–65)
CD4 T Cell Abs: 786 /uL (ref 400–1790)

## 2023-10-12 ENCOUNTER — Other Ambulatory Visit: Payer: Self-pay

## 2023-10-12 ENCOUNTER — Ambulatory Visit: Payer: Medicare Other | Admitting: Internal Medicine

## 2023-10-12 ENCOUNTER — Encounter: Payer: Self-pay | Admitting: Internal Medicine

## 2023-10-12 VITALS — BP 213/81 | HR 71 | Temp 98.2°F | Wt 151.0 lb

## 2023-10-12 DIAGNOSIS — Z23 Encounter for immunization: Secondary | ICD-10-CM

## 2023-10-12 DIAGNOSIS — I10 Essential (primary) hypertension: Secondary | ICD-10-CM

## 2023-10-12 DIAGNOSIS — Z79899 Other long term (current) drug therapy: Secondary | ICD-10-CM

## 2023-10-12 DIAGNOSIS — B20 Human immunodeficiency virus [HIV] disease: Secondary | ICD-10-CM

## 2023-10-12 DIAGNOSIS — E7800 Pure hypercholesterolemia, unspecified: Secondary | ICD-10-CM | POA: Diagnosis not present

## 2023-10-12 MED ORDER — ROSUVASTATIN CALCIUM 10 MG PO TABS: 10.0000 mg | ORAL_TABLET | Freq: Every day | ORAL | 11 refills | Status: DC

## 2023-10-12 MED ORDER — ODEFSEY 200-25-25 MG PO TABS: 1.0000 | ORAL_TABLET | Freq: Every day | ORAL | 11 refills | Status: DC

## 2023-10-12 NOTE — Progress Notes (Signed)
Patient ID: Madison W Purtle, female   DOB: 1941-08-10, 82 y.o.   MRN: [id number]  HPI Madison Newman with well controlled hiv disease,cd 4 count of 786/VL<20, on odefsey. Doesn't miss doses. Previously a patient of dr Orvan Falconer, now transitioning care with me. She states that she is in good health. Does take low dose 2 antihypertensives, Usually 137/70s. Follows with dr bland. BP elevated during this visit, denies headache, visual disturbances.  Overall she reports having good health.  Outpatient Encounter Medications as of 10/12/2023  Medication Sig   amLODipine (NORVASC) 5 MG tablet Take 5 mg by mouth daily.   Ascorbic Acid (VITAMIN C) 100 MG tablet Take 100 mg by mouth daily.   BYSTOLIC 10 MG tablet TK 1 T PO D   calcium carbonate 200 MG capsule Take 250 mg by mouth 2 (two) times daily with a meal.   rosuvastatin (CRESTOR) 10 MG tablet Take 1 tablet (10 mg total) by mouth daily.   spironolactone (ALDACTONE) 12.5 mg TABS Take 12.5 mg by mouth daily.     Vitamin D, Ergocalciferol, (DRISDOL) 50000 UNITS CAPS Take 2,000 Units by mouth daily.    [DISCONTINUED] emtricitabine-rilpivir-tenofovir AF (ODEFSEY) 200-25-25 MG TABS tablet Take 1 tablet by mouth daily.   emtricitabine-rilpivir-tenofovir AF (ODEFSEY) 200-25-25 MG TABS tablet Take 1 tablet by mouth daily.   No facility-administered encounter medications on file as of 10/12/2023.     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   ANXIETY 12/07/2007   LUMPECTOMY, BREAST, HX OF 12/16/2006   TAH/BSO, HX OF 12/16/2006   Human immunodeficiency virus (HIV) disease ([facility]) 12/14/2006   HYPERLIPIDEMIA 12/14/2006   ANEMIA NEC 12/14/2006   IRITIS 12/14/2006   Essential hypertension 12/14/2006   REFLUX ESOPHAGITIS 12/14/2006   BREAST CANCER, HX OF  12/14/2006     Health Maintenance Due  Topic Date Due   Zoster Vaccines- Shingrix (1 of 2) 09/08/1962   DEXA SCAN  Never done   Medicare Annual Wellness (AWV)  09/26/2016     Review of Systems Review of Systems  Constitutional: Negative for fever, chills, diaphoresis, activity change, appetite change, fatigue and unexpected weight change.  HENT: Negative for congestion, sore throat, rhinorrhea, sneezing, trouble swallowing and sinus pressure.  Eyes: Negative for photophobia and visual disturbance.  Respiratory: Negative for cough, chest tightness, shortness of breath, wheezing and stridor.  Cardiovascular: Negative for chest pain, palpitations and leg swelling.  Gastrointestinal: Negative for nausea, vomiting, abdominal pain, diarrhea, constipation, blood in stool, abdominal distention and anal bleeding.  Genitourinary: Negative for dysuria, hematuria, flank pain and difficulty urinating.  Musculoskeletal: Negative for myalgias, back pain, joint swelling, arthralgias and gait problem.  Skin: Negative for color change, pallor, rash and wound.  Neurological: Negative for dizziness, tremors, weakness and light-headedness.  Hematological: Negative for adenopathy. Does not bruise/bleed easily.  Psychiatric/Behavioral: Negative for behavioral problems, confusion, sleep disturbance, dysphoric mood, decreased concentration and agitation.   Physical Exam   BP (!) 220/90   Pulse 71   Temp 98.2 Newman (36.8 C) (Oral)   Wt 151 lb (68.5 kg)   SpO2 98%   BMI 24.37 kg/m   Physical Exam  Constitutional:  oriented to person, place, and time. appears well-developed and well-nourished. No distress.  HENT: [place]/AT, PERRLA, no scleral icterus Mouth/Throat: Oropharynx is  clear and moist. No oropharyngeal exudate.  Cardiovascular: Normal rate, regular rhythm and normal heart sounds. Exam reveals no gallop and no friction rub.  No murmur heard.  Pulmonary/Chest: Effort normal and breath sounds normal. No  respiratory distress.  has no wheezes.  Neck = supple, no nuchal rigidity Abdominal: Soft. Bowel sounds are normal.  exhibits no distension. There is no tenderness.  Lymphadenopathy: no cervical adenopathy. No axillary adenopathy Neurological: alert and oriented to person, place, and time.  Skin: Skin is warm and dry. No rash noted. No erythema.  Psychiatric: a normal mood and affect.  behavior is normal.   Lab Results  Component Value Date   CD4TCELL 36 09/28/2023   Lab Results  Component Value Date   CD4TABS 786 09/28/2023   CD4TABS 590 10/01/2022   CD4TABS 589 10/01/2021   Lab Results  Component Value Date   HIV1RNAQUANT Not Detected 09/28/2023   Lab Results  Component Value Date   HEPBSAB No 12/21/2006   Lab Results  Component Value Date   LABRPR NON-REACTIVE 09/28/2023    CBC Lab Results  Component Value Date   WBC 5.5 09/28/2023   RBC 4.08 09/28/2023   HGB 11.9 09/28/2023   HCT 37.2 09/28/2023   PLT 323 09/28/2023   MCV 91.2 09/28/2023   MCH 29.2 09/28/2023   MCHC 32.0 09/28/2023   RDW 12.7 09/28/2023   LYMPHSABS 1.5 05/18/2013   MONOABS 1.4 (H) 05/18/2013   EOSABS 83 09/28/2023    BMET Lab Results  Component Value Date   NA 141 09/28/2023   K 4.9 09/28/2023   CL 103 09/28/2023   CO2 32 09/28/2023   GLUCOSE 85 09/28/2023   BUN 14 09/28/2023   CREATININE 0.91 09/28/2023   CALCIUM 9.8 09/28/2023   GFRNONAA 47 (L) 07/06/2014   GFRAA 55 (L) 07/06/2014      Assessment and Plan Hiv disease = reviewed VL and CD 4 count. Still unchanged and well controlled. Will give refill on odefsey  Labile bp = will ask her to follow up with dr bland to reassess if need to change dosage of amlodipine  Hypercholesteremia = discussed that she may improved with taking medication. Also cardioprotective from [facility] data.she is will to Start rosuvastatin 10mg   Long term medication management = cr is stable on recent labs  Health maintenance = gave flu shot  today

## 2023-10-26 DIAGNOSIS — B20 Human immunodeficiency virus [HIV] disease: Secondary | ICD-10-CM | POA: Diagnosis not present

## 2023-10-26 DIAGNOSIS — E782 Mixed hyperlipidemia: Secondary | ICD-10-CM | POA: Diagnosis not present

## 2023-10-26 DIAGNOSIS — R7309 Other abnormal glucose: Secondary | ICD-10-CM | POA: Diagnosis not present

## 2023-10-26 DIAGNOSIS — Z6824 Body mass index (BMI) 24.0-24.9, adult: Secondary | ICD-10-CM | POA: Diagnosis not present

## 2023-10-26 DIAGNOSIS — I10 Essential (primary) hypertension: Secondary | ICD-10-CM | POA: Diagnosis not present

## 2023-11-05 LAB — COLOGUARD

## 2023-11-05 LAB — EXTERNAL GENERIC LAB PROCEDURE

## 2024-04-18 ENCOUNTER — Other Ambulatory Visit: Payer: Self-pay

## 2024-04-18 DIAGNOSIS — I872 Venous insufficiency (chronic) (peripheral): Secondary | ICD-10-CM

## 2024-05-02 ENCOUNTER — Encounter (HOSPITAL_COMMUNITY): Payer: Self-pay

## 2024-05-02 ENCOUNTER — Ambulatory Visit (HOSPITAL_COMMUNITY)
Admission: RE | Admit: 2024-05-02 | Discharge: 2024-05-02 | Disposition: A | Discharge status: Home / Self Care | Source: Ambulatory Visit | Attending: Surgery | Admitting: Surgery

## 2024-05-02 ENCOUNTER — Ambulatory Visit (HOSPITAL_BASED_OUTPATIENT_CLINIC_OR_DEPARTMENT_OTHER)
Admission: RE | Admit: 2024-05-02 | Discharge: 2024-05-02 | Disposition: A | Discharge status: Home / Self Care | Source: Ambulatory Visit | Attending: Surgery | Admitting: Surgery

## 2024-05-02 ENCOUNTER — Other Ambulatory Visit: Payer: Self-pay

## 2024-05-02 DIAGNOSIS — I872 Venous insufficiency (chronic) (peripheral): Secondary | ICD-10-CM

## 2024-05-02 DIAGNOSIS — B20 Human immunodeficiency virus [HIV] disease: Secondary | ICD-10-CM

## 2024-05-02 MED ORDER — ODEFSEY 200-25-25 MG PO TABS: 1.0000 | ORAL_TABLET | Freq: Every day | ORAL | 5 refills | Status: DC

## 2024-05-02 NOTE — Progress Notes (Unsigned)
 [facility]  ASSESSMENT / PLAN: Madison Newman is a 83 y.o. female with occasional cramping in her calf and ankle at night.  History not worrisome for claudication or chronic limb threatening ischemia.  Her vascular physical exam is normal.  Reflex testing shows mild chronic venous insufficiency.  Reassured patient about these findings.  She can follow-up with me on an as-needed basis.  CHIEF COMPLAINT: Cramping in the leg at night  HISTORY OF PRESENT ILLNESS: Madison Newman is a 83 y.o. female referred to clinic for evaluation of occasional episodic cramping in the muscles of the calf at night.  The patient does not notice any provoking or alleviating features.  She does endorse chronic fatigue after a COVID infection which has persisted much longer than typical.  She is still fairly active and engaged.  She is able to walk several miles without issue.  She does not describe any symptoms of claudication, rest pain.  She has no ulcers about her feet.  Past Medical History:  Diagnosis Date   Hypertension    No significant past surgical history  No family history on file.  Social History   Socioeconomic History   Marital status: Divorced    Spouse name: Not on file   Number of children: Not on file   Years of education: Not on file   Highest education level: Not on file  Occupational History   Not on file  Tobacco Use   Smoking status: Never   Smokeless tobacco: Never  Substance and Sexual Activity   Alcohol use: No    Alcohol/week: 0.0 standard drinks of alcohol   Drug use: No   Sexual activity: Never    Comment: declined condons  Other Topics Concern   Not on file  Social History Narrative   Not on file   Social Drivers of Health   Financial Resource Strain: Not on file  Food Insecurity: Not on file  Transportation Needs: Not on file  Physical Activity: Not on file  Stress: Not on file  Social Connections: Not on file  Intimate  Partner Violence: Not on file    No Known Allergies  Current Outpatient Medications  Medication Sig Dispense Refill   amLODipine  (NORVASC ) 5 MG tablet Take 5 mg by mouth daily.     Ascorbic Acid (VITAMIN C) 100 MG tablet Take 100 mg by mouth daily.     BYSTOLIC  10 MG tablet TK 1 T PO D     calcium  carbonate 200 MG capsule Take 250 mg by mouth 2 (two) times daily with a meal.     emtricitabine-rilpivir-tenofovir AF (ODEFSEY ) 200-25-25 MG TABS tablet Take 1 tablet by mouth daily. 30 tablet 11   rosuvastatin  (CRESTOR ) 10 MG tablet Take 1 tablet (10 mg total) by mouth daily. 30 tablet 11   spironolactone  (ALDACTONE ) 12.5 mg TABS Take 12.5 mg by mouth daily.       Vitamin D, Ergocalciferol, (DRISDOL) 50000 UNITS CAPS Take 2,000 Units by mouth daily.      No current facility-administered medications for this visit.    PHYSICAL EXAM Vitals:   05/03/24 1044  BP: (!) 156/62  Pulse: (!) 55  Temp: 98.9 F (37.2 C)  SpO2: 98%  Weight: 138 lb (62.6 kg)  Height: 5' 6 (1.676 m)    Well-appearing elderly woman in no distress Regular rate and rhythm Unlabored breathing Palpable dorsalis pedis pulses bilaterally Triphasic Doppler flow in the dorsalis pedis arteries bilaterally   PERTINENT  LABORATORY AND RADIOLOGIC DATA  Most recent CBC    Latest Ref Rng & Units 09/28/2023    8:43 AM 10/01/2022    9:21 AM 10/01/2021    9:06 AM  CBC  WBC 3.8 - 10.8 Thousand/uL 5.5  5.0  5.3   Hemoglobin 11.7 - 15.5 g/dL 88.0  88.2  88.9   Hematocrit 35.0 - 45.0 % 37.2  35.8  33.7   Platelets 140 - 400 Thousand/uL 323  317  293      Most recent CMP    Latest Ref Rng & Units 09/28/2023    8:43 AM 10/01/2022    9:21 AM 10/01/2021    9:06 AM  CMP  Glucose 65 - 99 mg/dL 85  93  86   BUN 7 - 25 mg/dL 14  11  21    Creatinine 0.60 - 0.95 mg/dL 9.08  9.27  9.10   Sodium 135 - 146 mmol/L 141  139  138   Potassium 3.5 - 5.3 mmol/L 4.9  3.9  4.5   Chloride 98 - 110 mmol/L 103  101  103   CO2 20 - 32  mmol/L 32  28  30   Calcium  8.6 - 10.4 mg/dL 9.8  9.9  9.8   Total Protein 6.1 - 8.1 g/dL 7.9  7.8  7.3   Total Bilirubin 0.2 - 1.2 mg/dL 0.4  0.5  0.6   AST 10 - 35 U/L 18  17  19    ALT 6 - 29 U/L 12  8  9      Renal function CrCl cannot be calculated (Patient's most recent lab result is older than the maximum 21 days allowed.).  No results found for: HGBA1C  LDL Cholesterol (Calc)  Date Value Ref Range Status  09/28/2023 159 (H) mg/dL (calc) Final    Comment:    Reference range: <100 . Desirable range <100 mg/dL for primary prevention;   <70 mg/dL for patients with CHD or diabetic patients  with > or = 2 CHD risk factors. SABRA LDL-C is now calculated using the Martin-Hopkins  calculation, which is a validated novel method providing  better accuracy than the Friedewald equation in the  estimation of LDL-C.  Gladis APPLETHWAITE et al. SANDREA. 7986;689(80): 2061-2068  (http://education.[facility].com/faq/FAQ164)     Debby SAILOR. Magda, MD FACS [facility] Phone Number: [phone] 05/02/2024 9:02 AM   Total time spent on preparing this encounter including chart review, data review, collecting history, examining the patient, and coordinating care: 40 minutes  Portions of this report may have been transcribed using voice recognition software.  Every effort has been made to ensure accuracy; however, inadvertent computerized transcription errors may still be present.

## 2024-05-03 ENCOUNTER — Ambulatory Visit: Attending: Vascular Surgery | Admitting: Vascular Surgery

## 2024-05-03 ENCOUNTER — Encounter: Payer: Self-pay | Admitting: Vascular Surgery

## 2024-05-03 VITALS — BP 156/62 | HR 55 | Temp 98.9°F | Ht 66.0 in | Wt 138.0 lb

## 2024-05-03 DIAGNOSIS — R252 Cramp and spasm: Secondary | ICD-10-CM | POA: Insufficient documentation

## 2024-09-26 ENCOUNTER — Other Ambulatory Visit: Payer: Self-pay

## 2024-09-26 ENCOUNTER — Other Ambulatory Visit: Payer: Medicare Other

## 2024-09-26 DIAGNOSIS — Z113 Encounter for screening for infections with a predominantly sexual mode of transmission: Secondary | ICD-10-CM

## 2024-09-26 DIAGNOSIS — Z79899 Other long term (current) drug therapy: Secondary | ICD-10-CM

## 2024-09-26 DIAGNOSIS — B20 Human immunodeficiency virus [HIV] disease: Secondary | ICD-10-CM

## 2024-09-27 LAB — T-HELPER CELL (CD4) - (RCID CLINIC ONLY)
CD4 % Helper T Cell: 39 % (ref 33–65)
CD4 T Cell Abs: 794 /uL (ref 400–1790)

## 2024-09-28 LAB — COMPLETE METABOLIC PANEL WITHOUT GFR
AG Ratio: 1.1 (calc) (ref 1.0–2.5)
ALT: 9 U/L (ref 6–29)
AST: 20 U/L (ref 10–35)
Albumin: 4.2 g/dL (ref 3.6–5.1)
Alkaline phosphatase (APISO): 91 U/L (ref 37–153)
BUN/Creatinine Ratio: 25 (calc) — ABNORMAL HIGH (ref 6–22)
BUN: 25 mg/dL (ref 7–25)
CO2: 30 mmol/L (ref 20–32)
Calcium: 10.1 mg/dL (ref 8.6–10.4)
Chloride: 98 mmol/L (ref 98–110)
Creat: 1.02 mg/dL — ABNORMAL HIGH (ref 0.60–0.95)
Globulin: 3.9 g/dL — ABNORMAL HIGH (ref 1.9–3.7)
Glucose, Bld: 87 mg/dL (ref 65–99)
Potassium: 4 mmol/L (ref 3.5–5.3)
Sodium: 137 mmol/L (ref 135–146)
Total Bilirubin: 0.4 mg/dL (ref 0.2–1.2)
Total Protein: 8.1 g/dL (ref 6.1–8.1)

## 2024-09-28 LAB — CBC WITH DIFFERENTIAL/PLATELET
Absolute Lymphocytes: 1971 {cells}/uL (ref 850–3900)
Absolute Monocytes: 515 {cells}/uL (ref 200–950)
Basophils Absolute: 31 {cells}/uL (ref 0–200)
Basophils Relative: 0.6 %
Eosinophils Absolute: 73 {cells}/uL (ref 15–500)
Eosinophils Relative: 1.4 %
HCT: 35.1 % — ABNORMAL LOW (ref 35.9–46.0)
Hemoglobin: 11.2 g/dL — ABNORMAL LOW (ref 11.7–15.5)
MCH: 28.6 pg (ref 27.0–33.0)
MCHC: 31.9 g/dL (ref 31.6–35.4)
MCV: 89.5 fL (ref 81.4–101.7)
MPV: 9.7 fL (ref 7.5–12.5)
Monocytes Relative: 9.9 %
Neutro Abs: 2610 {cells}/uL (ref 1500–7800)
Neutrophils Relative %: 50.2 %
Platelets: 317 Thousand/uL (ref 140–400)
RBC: 3.92 Million/uL (ref 3.80–5.10)
RDW: 13.1 % (ref 11.0–15.0)
Total Lymphocyte: 37.9 %
WBC: 5.2 Thousand/uL (ref 3.8–10.8)

## 2024-09-28 LAB — LIPID PANEL
Cholesterol: 153 mg/dL (ref <200)
HDL: 69 mg/dL (ref >=50)
LDL Cholesterol (Calc): 67 mg/dL
Non-HDL Cholesterol (Calc): 84 mg/dL (ref <130)
Total CHOL/HDL Ratio: 2.2 (calc) (ref <5.0)
Triglycerides: 86 mg/dL (ref <150)

## 2024-09-28 LAB — HIV-1 RNA QUANT-NO REFLEX-BLD
HIV 1 RNA Quant: NOT DETECTED {copies}/mL
HIV-1 RNA Quant, Log: NOT DETECTED {Log_copies}/mL

## 2024-09-28 LAB — SYPHILIS: RPR W/REFLEX TO RPR TITER AND TREPONEMAL ANTIBODIES, TRADITIONAL SCREENING AND DIAGNOSIS ALGORITHM: RPR Ser Ql: NONREACTIVE

## 2024-10-10 ENCOUNTER — Ambulatory Visit: Payer: Self-pay | Admitting: Internal Medicine

## 2024-10-10 ENCOUNTER — Other Ambulatory Visit: Payer: Self-pay

## 2024-10-10 ENCOUNTER — Encounter: Payer: Self-pay | Admitting: Internal Medicine

## 2024-10-10 VITALS — BP 147/61 | HR 93 | Temp 97.0°F | Resp 16 | Wt 137.2 lb

## 2024-10-10 DIAGNOSIS — Z79899 Other long term (current) drug therapy: Secondary | ICD-10-CM | POA: Diagnosis not present

## 2024-10-10 DIAGNOSIS — I10 Essential (primary) hypertension: Secondary | ICD-10-CM

## 2024-10-10 DIAGNOSIS — B20 Human immunodeficiency virus [HIV] disease: Secondary | ICD-10-CM

## 2024-10-10 DIAGNOSIS — Z23 Encounter for immunization: Secondary | ICD-10-CM

## 2024-10-10 MED ORDER — ODEFSEY 200-25-25 MG PO TABS: 1.0000 | ORAL_TABLET | Freq: Every day | ORAL | 11 refills | Status: DC

## 2024-10-10 NOTE — Progress Notes (Unsigned)
 RFV: annual HIV visit  Patient ID: Madison Newman, female   DOB: [birth year], 83 y.o.   MRN: [id number]  HPI Madison Newman with hiv disease, 794/VL<20, on odefsey . Has excellent adherence   Outpatient Encounter Medications as of 10/10/2024  Medication Sig   amLODipine (NORVASC) 5 MG tablet Take 5 mg by mouth daily.   Ascorbic Acid (VITAMIN C) 100 MG tablet Take 100 mg by mouth daily.   calcium  carbonate 200 MG capsule Take 250 mg by mouth 2 (two) times daily with a meal.   emtricitabine-rilpivir-tenofovir AF (ODEFSEY ) 200-25-25 MG TABS tablet Take 1 tablet by mouth daily.   indapamide (LOZOL) 1.25 MG tablet Take 1.25 mg by mouth every morning.   rosuvastatin  (CRESTOR ) 10 MG tablet Take 1 tablet (10 mg total) by mouth daily.   Vitamin D, Ergocalciferol, (DRISDOL) 50000 UNITS CAPS Take 2,000 Units by mouth daily.    BYSTOLIC 10 MG tablet TK 1 T PO D (Patient not taking: Reported on 10/10/2024)   spironolactone (ALDACTONE) 12.5 mg TABS Take 12.5 mg by mouth daily.   (Patient not taking: Reported on 10/10/2024)   No facility-administered encounter medications on file as of 10/10/2024.     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   ANXIETY 12/07/2007   LUMPECTOMY, BREAST, HX OF 12/16/2006   TAH/BSO, HX OF 12/16/2006   Human immunodeficiency virus (HIV) disease (HCC) 12/14/2006   HYPERLIPIDEMIA 12/14/2006   ANEMIA NEC 12/14/2006   IRITIS 12/14/2006   Essential hypertension 12/14/2006   REFLUX ESOPHAGITIS 12/14/2006   BREAST CANCER, HX OF 12/14/2006     Health Maintenance Due  Topic Date Due   Medicare Annual Wellness (AWV)  Never done   Zoster Vaccines- Shingrix (1 of 2) 09/08/1962   Bone Density Scan  Never done   Influenza Vaccine  05/27/2024   COVID-19 Vaccine (5 - 2025-26  season) 06/27/2024   DTaP/Tdap/Td (2 - Td or Tdap) 07/20/2024     Review of Systems  Physical Exam   BP (!) 180/68   Pulse 93   Temp (!) 97 Newman (36.1 C) (Temporal)   Resp 16   Wt 137 lb 3.2 oz (62.2 kg)   SpO2 98%   BMI 22.14 kg/m    Lab Results  Component Value Date   CD4TCELL 39 09/26/2024   Lab Results  Component Value Date   CD4TABS 794 09/26/2024   CD4TABS 786 09/28/2023   CD4TABS 590 10/01/2022   Lab Results  Component Value Date   HIV1RNAQUANT NOT DETECTED 09/26/2024   Lab Results  Component Value Date   HEPBSAB No 12/21/2006   Lab Results  Component Value Date   LABRPR NON-REACTIVE 09/26/2024    CBC Lab Results  Component Value Date   WBC 5.2 09/26/2024   RBC 3.92 09/26/2024   HGB 11.2 (L) 09/26/2024   HCT 35.1 (L) 09/26/2024   PLT 317 09/26/2024   MCV 89.5 09/26/2024   MCH 28.6 09/26/2024   MCHC 31.9 09/26/2024   RDW 13.1 09/26/2024   LYMPHSABS 1.5 05/18/2013   MONOABS 1.4 (H) 05/18/2013   EOSABS 73 09/26/2024    BMET Lab Results  Component Value Date   NA 137 09/26/2024   K 4.0 09/26/2024   CL 98 09/26/2024   CO2 30 09/26/2024   GLUCOSE 87  09/26/2024   BUN 25 09/26/2024   CREATININE 1.02 (H) 09/26/2024   CALCIUM  10.1 09/26/2024   GFRNONAA 47 (L) 07/06/2014   GFRAA 55 (L) 07/06/2014      Assessment and Plan  Hiv disease= well controlled will give refills for 1 year  Long term medication management = cr is stable  Health maintenance= will give flu and covid vaccine today  Hypertension = poorly controlled. Will check today's visit. If still elevated recommend to see dr bland for further management

## 2024-10-24 ENCOUNTER — Other Ambulatory Visit: Payer: Self-pay | Admitting: Internal Medicine

## 2024-11-02 ENCOUNTER — Other Ambulatory Visit: Payer: Self-pay | Admitting: Internal Medicine

## 2024-11-02 DIAGNOSIS — B20 Human immunodeficiency virus [HIV] disease: Secondary | ICD-10-CM

## 2024-11-02 MED ORDER — ODEFSEY 200-25-25 MG PO TABS: 1.0000 | ORAL_TABLET | Freq: Every day | ORAL | 11 refills | Status: AC

## 2024-11-02 NOTE — Telephone Encounter (Signed)
 Patient called regarding refill. States she uses [facility] for both rosuvastatin  and Odefsey . Will re-order Odefsey  for correct pharmacy.   Monalisa Bayless, BSN, RN

## 2025-09-25 ENCOUNTER — Other Ambulatory Visit: Payer: Self-pay

## 2025-10-09 ENCOUNTER — Ambulatory Visit: Payer: Self-pay | Admitting: Internal Medicine
